# Patient Record
Sex: Female | Born: 1960 | Race: Black or African American | Hispanic: No | Marital: Single | Smoking: Never smoker
Health system: Southern US, Community
[De-identification: ages and names within clinical notes are randomized; demographics above are authoritative.]

---

## 2007-04-13 ENCOUNTER — Emergency Department: Payer: Self-pay | Admitting: Emergency Medicine

## 2007-04-14 ENCOUNTER — Ambulatory Visit: Payer: Self-pay | Admitting: Surgery

## 2007-04-14 ENCOUNTER — Ambulatory Visit: Payer: Self-pay | Admitting: Emergency Medicine

## 2007-04-14 IMAGING — US ABDOMEN ULTRASOUND
1 series · 17 of 25 positions shown · non-contrast
Comparison: none

REASON FOR EXAM: abdominal pain gallstones
COMMENTS:

[Series 1: abdomen ultrasound · 17 of 65 slices shown]
[im 1/65]
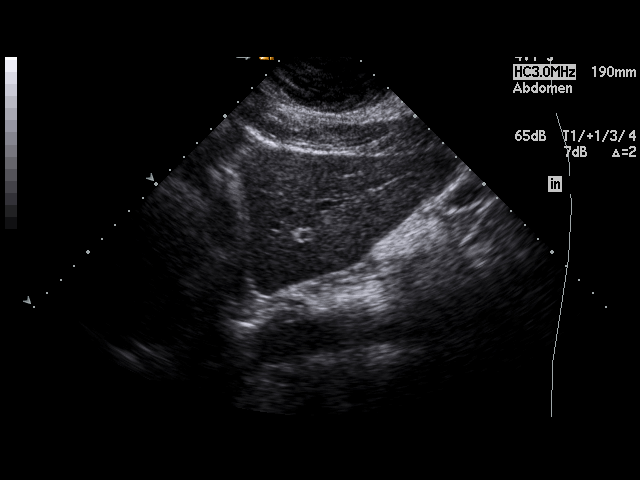
[im 6/65]
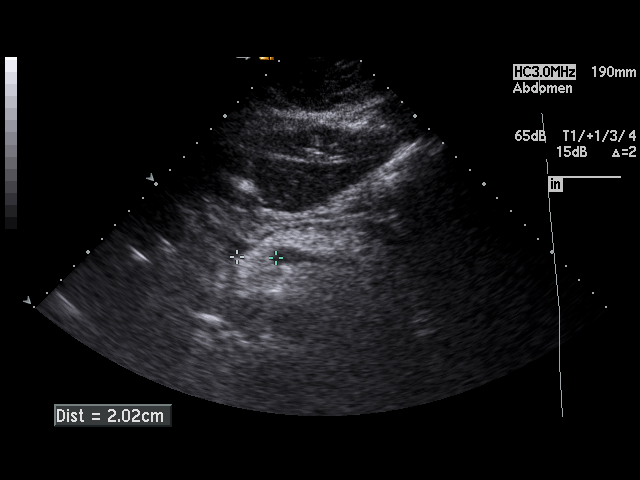
[im 9/65]
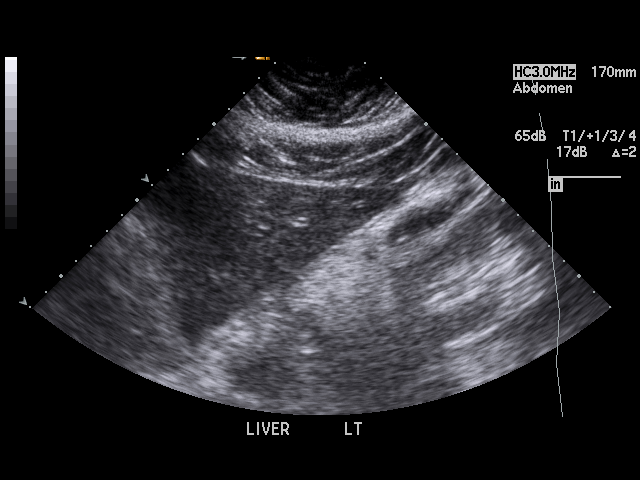
[im 14/65]
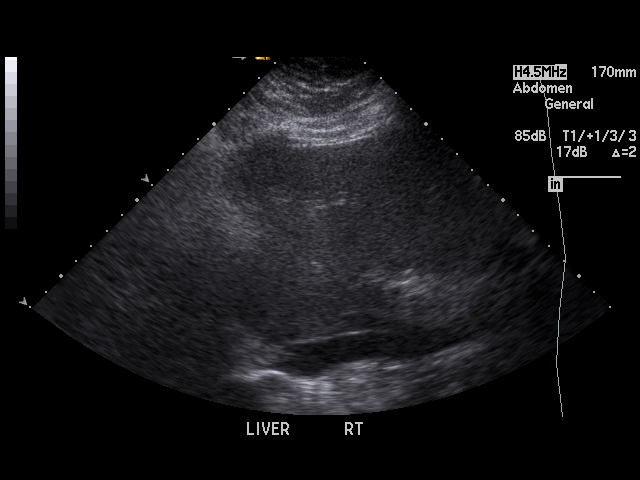
[im 17/65]
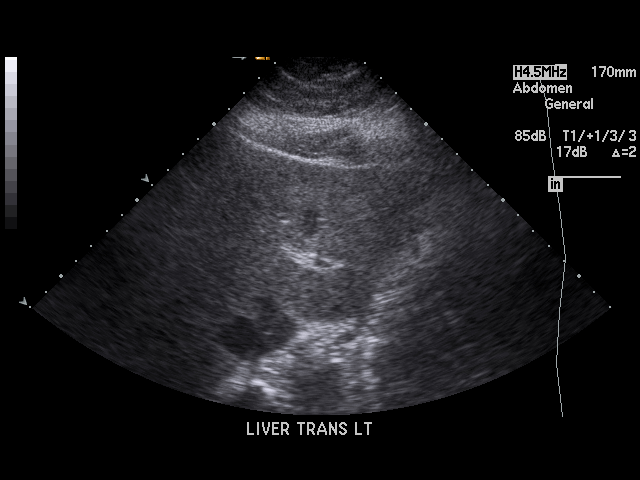
[im 22/65]
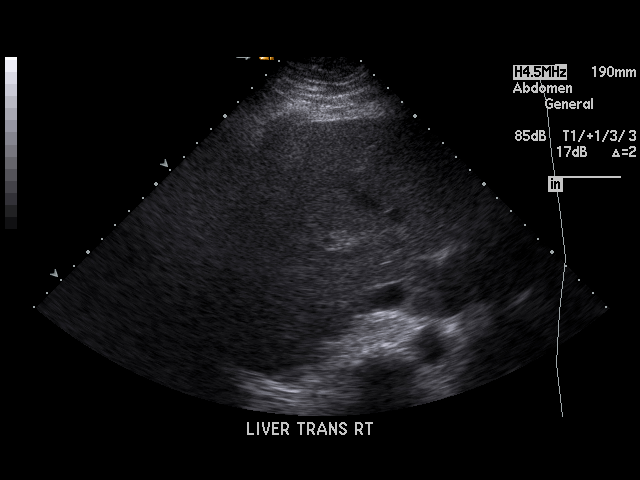
[im 25/65]
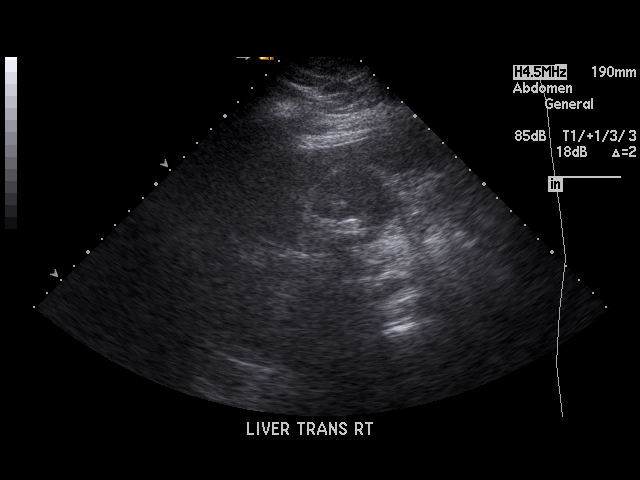
[im 30/65]
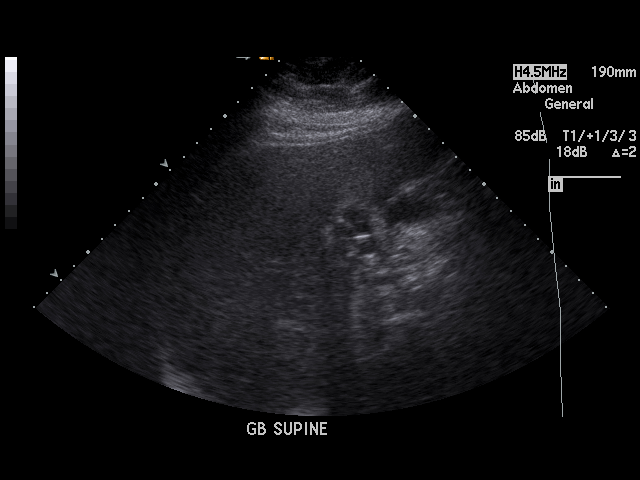
[im 33/65]
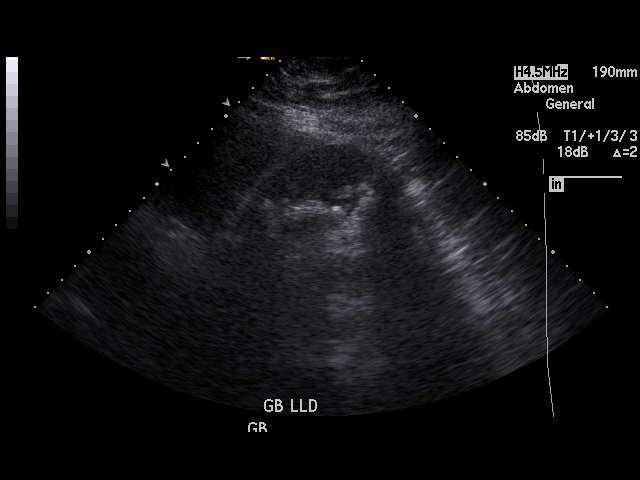
[im 35/65]
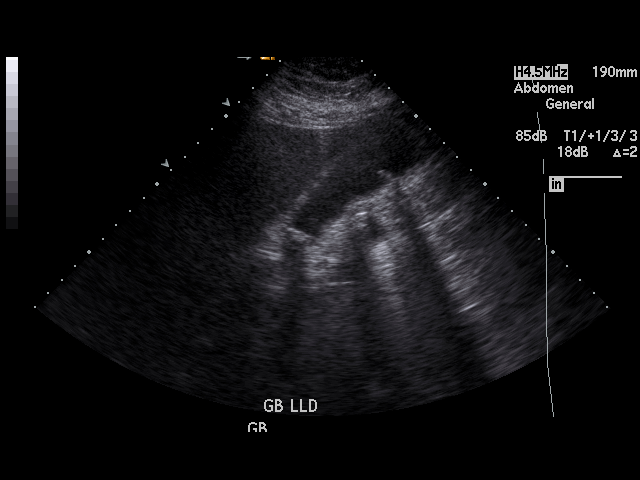
[im 41/65]
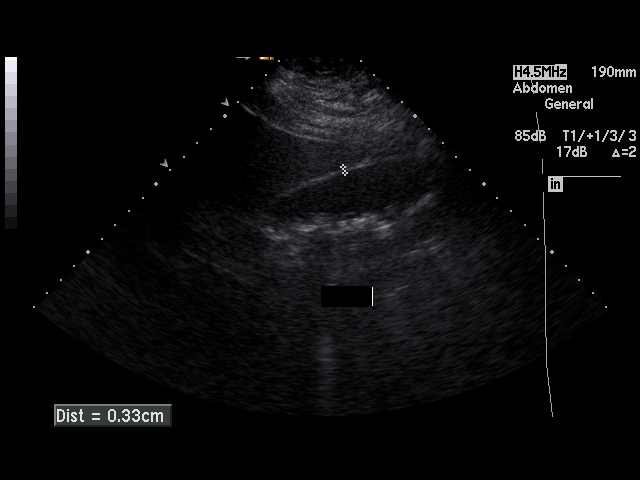
[im 43/65]
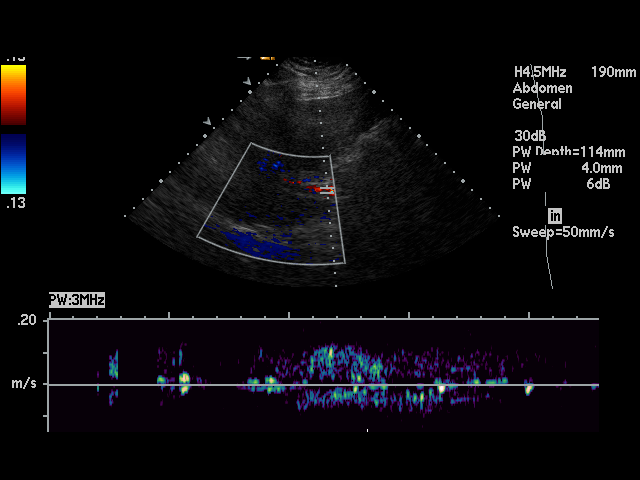
[im 49/65]
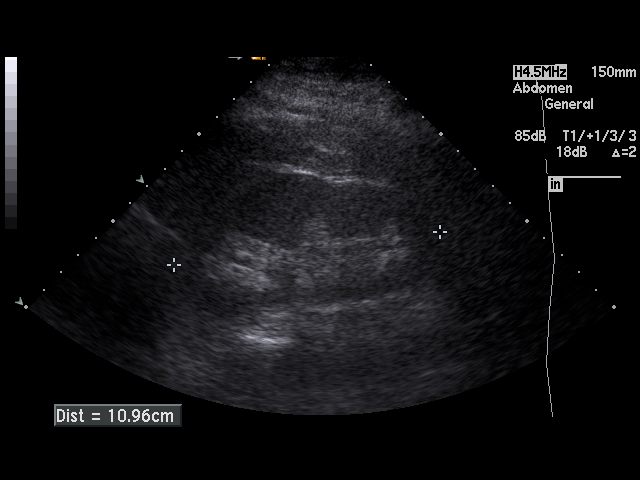
[im 51/65]
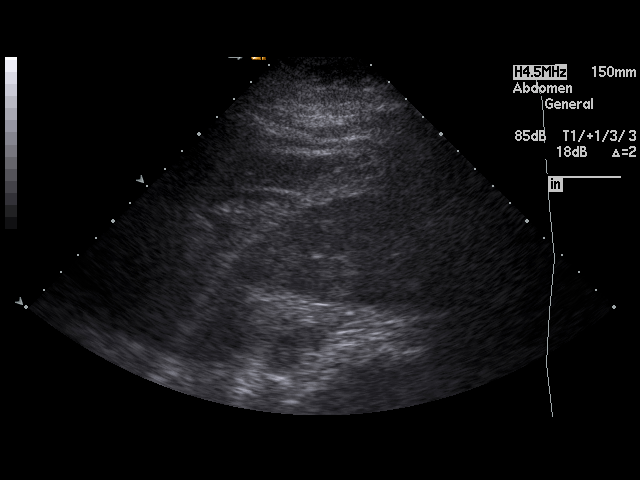
[im 57/65]
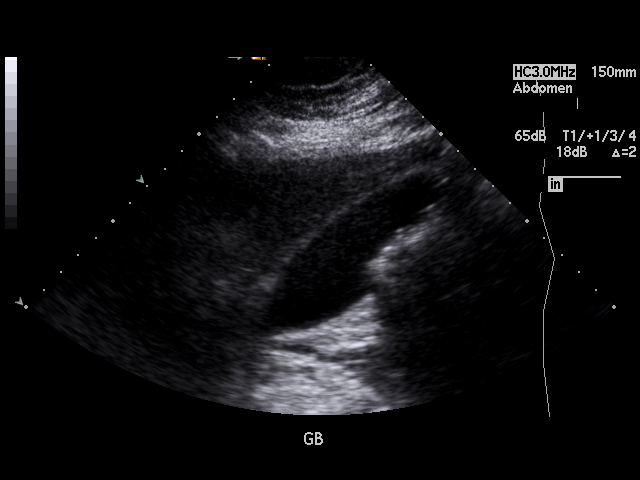
[im 59/65]
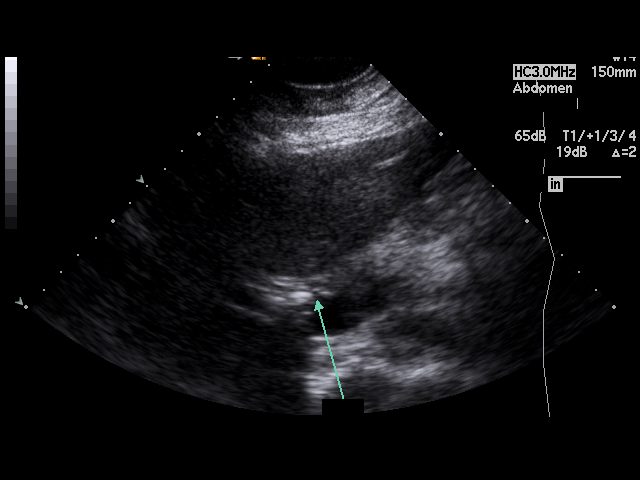
[im 65/65]
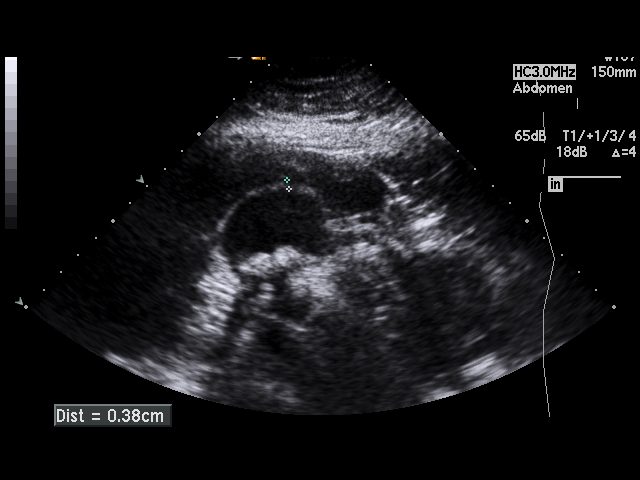

[17 of 25 positions shown; findings below may reference images not displayed]

PROCEDURE:     US  - US ABDOMEN GENERAL SURVEY  - [DATE] [DATE]

RESULT:     The liver exhibits changes consistent with fatty infiltration.
No focal mass or ductal dilation is seen. The gallbladder is contracted and
contains echogenic bile or sludge as well as nonmobile stones. There is no
sonographic Murphy's sign. The gallbladder wall is minimally thickened at
3.8 mm. The common bile duct measures 3.8 mm in diameter as well. The
pancreas, spleen, abdominal aorta, inferior vena cava, and kidneys are
normal in appearance. There is no evidence of ascites.
IMPRESSION: There are multiple gallstones present and the gallbladder is contracted with
somewhat thickened wall. There is no pericholecystic fluid or sonographic
Murphy's sign.

This report was called to the [HOSPITAL] the conclusion of the
study.

## 2008-06-05 ENCOUNTER — Emergency Department: Payer: Self-pay | Admitting: Emergency Medicine

## 2017-04-21 ENCOUNTER — Other Ambulatory Visit: Payer: Self-pay

## 2017-04-21 ENCOUNTER — Encounter: Payer: Medicare HMO | Attending: Pulmonary Disease

## 2017-04-21 VITALS — Ht 65.2 in | Wt 274.2 lb

## 2017-04-21 DIAGNOSIS — J984 Other disorders of lung: Secondary | ICD-10-CM | POA: Diagnosis not present

## 2017-04-21 DIAGNOSIS — R0602 Shortness of breath: Secondary | ICD-10-CM | POA: Diagnosis present

## 2017-04-21 DIAGNOSIS — J849 Interstitial pulmonary disease, unspecified: Secondary | ICD-10-CM

## 2017-04-21 NOTE — Progress Notes (Signed)
Pulmonary Individual Treatment Plan  Patient Details  Name: Nichole Reynolds MRN: [id number] Date of Birth: Jun 14, 1960 Referring Provider:     Pulmonary Rehab from 04/21/2017 in [facility] Cardiac and Pulmonary Rehab  Referring Provider  Normajean Glasgow MD      Initial Encounter Date:    Pulmonary Rehab from 04/21/2017 in [facility] Cardiac and Pulmonary Rehab  Date  04/21/17  Referring Provider  Normajean Glasgow MD      Visit Diagnosis: Interstitial lung disease (HCC)  Patient's Home Medications on Admission: No current outpatient medications on file.  Past Medical History: History reviewed. No pertinent past medical history.  Tobacco Use: Social History   Tobacco Use  Smoking Status Never Smoker  Smokeless Tobacco Never Used    Labs: Recent Review Flowsheet Data    There is no flowsheet data to display.       Pulmonary Assessment Scores: Pulmonary Assessment Scores    Row Name 04/21/17 0931         ADL UCSD   ADL Phase  Entry     SOB Score total  77     Rest  0     Walk  1     Stairs  5     Bath  2     Dress  1     Shop  4       CAT Score   CAT Score  22       mMRC Score   mMRC Score  4        Pulmonary Function Assessment: Pulmonary Function Assessment - 04/21/17 0939      Breath   Bilateral Breath Sounds  Clear    Shortness of Breath  Yes;Limiting activity;Fear of Shortness of Breath       Exercise Target Goals: Date: 04/21/17  Exercise Program Goal: Individual exercise prescription set with THRR, safety & activity barriers. Participant demonstrates ability to understand and report RPE using BORG scale, to self-measure pulse accurately, and to acknowledge the importance of the exercise prescription.  Exercise Prescription Goal: Starting with aerobic activity 30 plus minutes a day, 3 days per week for initial exercise prescription. Provide home exercise prescription and guidelines that participant acknowledges understanding prior to  discharge.  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 HR  137  bpm     Max Ex. BP  146/88     2 Minute Post BP  138/74       Interval HR   1 Minute HR  115     2 Minute HR  125     3 Minute HR  133     4 Minute HR  116     5 Minute HR  109     6 Minute HR  137     2 Minute Post HR  81     Interval Heart Rate?  Yes       Interval Oxygen   Interval Oxygen?  Yes     Baseline Oxygen Saturation %  100 %     1 Minute Oxygen Saturation %  95 %     1 Minute Liters of Oxygen  4 L     2 Minute Oxygen Saturation %  94 %     2 Minute Liters of Oxygen  4 L     3 Minute Oxygen Saturation %  96 % rest 3:38-4:28     3 Minute Liters of Oxygen  4 L     4 Minute Oxygen Saturation %  92 %     4 Minute Liters of Oxygen  4 L     5 Minute Oxygen Saturation %  98 %     5 Minute Liters of Oxygen  4 L     6 Minute Oxygen Saturation %  94 %     6 Minute Liters of Oxygen  4 L     2 Minute Post Oxygen Saturation %  100 %     2 Minute Post Liters of Oxygen  4 L       Oxygen Initial Assessment: Oxygen Initial Assessment - 04/21/17 0940      Home Oxygen   Home Oxygen Device  Home Concentrator;E-Tanks    Sleep Oxygen Prescription  Continuous;CPAP     Liters per minute  6    Home Exercise Oxygen Prescription  Continuous    Liters per minute  4    Home at Rest Exercise Oxygen Prescription  Continuous    Liters per minute  4    Compliance with Home Oxygen Use  Yes      Initial 6 min Walk   Oxygen Used  Continuous    Liters per minute  4      Program Oxygen Prescription   Program Oxygen Prescription  Continuous    Liters per minute  4      Intervention   Short Term Goals  To learn and understand importance of maintaining oxygen saturations>88%;To learn and understand importance of monitoring SPO2 with pulse oximeter and demonstrate accurate use of the pulse oximeter.;To learn and demonstrate proper pursed lip breathing techniques or other breathing techniques.;To learn and exhibit compliance with exercise, home and travel O2 prescription;To learn and demonstrate proper use of respiratory medications Symbicort, Albuterol Nebulizer prn and Ventolin    Long  Term Goals  Exhibits compliance with exercise, home and travel O2 prescription;Verbalizes importance of monitoring SPO2 with pulse oximeter and return demonstration;Maintenance of O2 saturations>88%;Exhibits proper breathing techniques, such as pursed lip breathing or other method taught during program session;Compliance with respiratory medication;Demonstrates proper use of MDI's       Oxygen Re-Evaluation:   Oxygen Discharge (Final Oxygen Re-Evaluation):   Initial Exercise Prescription: Initial Exercise Prescription - 04/21/17 1100      Date of Initial Exercise RX and  Referring Provider   Date  04/21/17    Referring Provider  Normajean Glasgow MD      Oxygen   Oxygen  Continuous    Liters  4      Treadmill   MPH  1.2    Grade  0    Minutes  15    METs  1.92      NuStep   Level  1    SPM  80    Minutes  15    METs  1.9      REL-XR   Level  1    Speed  50    Minutes  15    METs  1.9      Prescription Details   Frequency (times per week)  3    Duration   Progress to 45 minutes of aerobic exercise without signs/symptoms of physical distress      Intensity   THRR 40-80% of Max Heartrate  108-147    Ratings of Perceived Exertion  11-13    Perceived Dyspnea  0-4      Progression   Progression  Continue to progress workloads to maintain intensity without signs/symptoms of physical distress.      Resistance Training   Training Prescription  Yes    Weight  3 lbs    Reps  10-15       Perform Capillary Blood Glucose checks as needed.  Exercise Prescription Changes: Exercise Prescription Changes    Row Name 04/21/17 1100             Response to Exercise   Blood Pressure (Admit)  146/84       Blood Pressure (Exercise)  146/88       Blood Pressure (Exit)  138/74       Heart Rate (Admit)  69 bpm       Heart Rate (Exercise)  137 bpm       Heart Rate (Exit)  81 bpm       Oxygen Saturation (Admit)  100 %       Oxygen Saturation (Exercise)  92 %       Oxygen Saturation (Exit)  100 %       Rating of Perceived Exertion (Exercise)  15       Perceived Dyspnea (Exercise)  1       Symptoms  knee pain 7/10       Comments  walk test results          Exercise Comments:   Exercise Goals and Review: Exercise Goals    Row Name 04/21/17 1111             Exercise Goals   Increase Physical Activity  Yes       Intervention  Provide advice, education, support and counseling about physical activity/exercise needs.;Develop an individualized exercise prescription for aerobic and resistive training based on initial evaluation findings, risk stratification, comorbidities and participant's personal goals.       Expected Outcomes  Achievement of increased cardiorespiratory fitness and enhanced flexibility, muscular endurance and strength shown through measurements of functional capacity and personal statement of participant.       Increase Strength and Stamina  Yes       Intervention  Provide advice, education, support and counseling about  physical activity/exercise needs.;Develop an individualized exercise prescription for aerobic and resistive training based on initial evaluation findings, risk stratification, comorbidities and participant's personal goals.       Expected Outcomes  Achievement of increased cardiorespiratory fitness and enhanced flexibility, muscular endurance and strength shown through measurements of functional capacity and personal statement of participant.       Able to understand and use rate of perceived exertion (RPE) scale  Yes       Intervention  Provide education and explanation on how to use RPE scale       Expected Outcomes  Short Term: Able to use RPE daily in rehab to express subjective intensity level;Long Term:  Able to use RPE to guide intensity level when exercising independently       Able to understand and use Dyspnea scale  Yes       Intervention  Provide education and explanation on how to use Dyspnea scale       Expected Outcomes  Short Term: Able to use Dyspnea scale daily in rehab to express subjective sense of shortness of breath during exertion;Long Term: Able to use Dyspnea scale to guide intensity level when exercising independently       Knowledge and understanding of Target Heart Rate Range (THRR)  Yes       Intervention  Provide education and explanation of THRR including how the numbers were predicted and where they are located for reference       Expected Outcomes  Short Term: Able to state/look up THRR;Long Term: Able to use THRR to govern intensity when exercising independently;Short Term: Able to use daily as guideline for intensity in rehab       Able to check pulse independently  Yes       Intervention  Provide education and demonstration on how to check pulse in carotid and radial arteries.;Review the importance of being able to check your own pulse for safety during independent exercise       Expected Outcomes  Short Term: Able to explain why pulse checking is important during  independent exercise;Long Term: Able to check pulse independently and accurately       Understanding of Exercise Prescription  Yes       Intervention  Provide education, explanation, and written materials on patient's individual exercise prescription       Expected Outcomes  Short Term: Able to explain program exercise prescription;Long Term: Able to explain home exercise prescription to exercise independently          Exercise Goals Re-Evaluation :   Discharge Exercise Prescription (Final Exercise Prescription Changes): Exercise Prescription Changes - 04/21/17 1100      Response to Exercise   Blood Pressure (Admit)  146/84    Blood Pressure (Exercise)  146/88    Blood Pressure (Exit)  138/74    Heart Rate (Admit)  69 bpm    Heart Rate (Exercise)  137 bpm    Heart Rate (Exit)  81 bpm    Oxygen Saturation (Admit)  100 %    Oxygen Saturation (Exercise)  92 %    Oxygen Saturation (Exit)  100 %    Rating of Perceived Exertion (Exercise)  15    Perceived Dyspnea (Exercise)  1    Symptoms  knee pain 7/10    Comments  walk test results       Nutrition:  Target Goals: Understanding of nutrition guidelines, daily intake of sodium 1500mg , cholesterol 200mg , calories 30% from fat and 7% or less from saturated fats, daily to have 5 or more servings of fruits and vegetables.  Biometrics: Pre Biometrics - 04/21/17 1112      Pre Biometrics   Height  5' 5.2" (1.656 m)    Weight  274 lb 3.2 oz (124.4 kg)    Waist Circumference  46.5 inches    Hip Circumference  55 inches    Waist to Hip Ratio  0.85 %    BMI (Calculated)  45.35        Nutrition Therapy Plan and Nutrition Goals: Nutrition Therapy & Goals - 04/21/17 0930      Personal Nutrition Goals   Comments  Patient would like to meet with the dietician. She has had weight loss surgery in July and would like some pointers to help her diet.      Intervention Plan   Intervention  Prescribe, educate and counsel regarding  individualized specific dietary modifications aiming towards targeted core components such as weight, hypertension, lipid management, diabetes, heart failure and other comorbidities.;Nutrition handout(s) given to patient.    Expected Outcomes  Short Term Goal: Understand basic principles of dietary content, such as calories, fat, sodium, cholesterol and nutrients.;Short Term Goal: A plan has been developed with personal nutrition goals set during dietitian appointment.;Long Term Goal: Adherence to prescribed nutrition plan.       Nutrition Discharge: Rate Your Plate Scores:   Nutrition Goals Re-Evaluation:   Nutrition Goals Discharge (Final Nutrition Goals Re-Evaluation):   Psychosocial: Target Goals: Acknowledge presence or absence of significant depression and/or stress, maximize coping skills, provide positive support system. Participant is able to verbalize types and ability to use techniques and skills needed for reducing stress and depression.   Initial Review & Psychosocial Screening: Initial Psych Review & Screening - 04/21/17 0925      Initial Review   Current issues with  Current Depression;Current Stress Concerns;Current Sleep Concerns    Source of Stress Concerns  Chronic Illness;Family;Unable to participate in former interests or hobbies;Unable to perform yard/household activities;Transportation    Comments  He mother died two years ago in a fire. She needs someone to take her to [facility].      Family Dynamics   Good Support System?  Yes    Comments  Cherly HensenCousins are a great support system. Her son is very supportive.      Barriers   Psychosocial barriers to participate in program  The patient should benefit from training in stress management and relaxation.      Screening Interventions   Interventions  Yes;Encouraged to exercise;Program counselor consult;Provide feedback about the scores to participant;To provide support and resources with identified psychosocial needs     Expected Outcomes  Short Term goal: Utilizing psychosocial counselor, staff and physician to assist with identification of specific Stressors or current issues interfering with healing process. Setting desired goal for each stressor or current issue identified.;Long Term Goal: Stressors or current issues are controlled or eliminated.;Short Term goal: Identification and review with participant of any Quality of Life or Depression concerns found by scoring the questionnaire.;Long Term goal: The participant improves quality of Life and PHQ9 Scores as seen by post scores and/or verbalization of changes       Quality of Life Scores:   PHQ-9: Recent Review Flowsheet Data    [facility]   Decreased Interest 3   Down, Depressed, Hopeless 3   PHQ - 2 Score 6   Altered sleeping 0   Tired, decreased energy 1   Change in appetite 0   Feeling bad or failure about yourself  3   Trouble concentrating 3   Moving slowly or fidgety/restless 1   Suicidal thoughts 1  PHQ-9 Score 15   Difficult doing work/chores Somewhat difficult     Interpretation of Total Score  Total Score Depression Severity:  1-4 = Minimal depression, 5-9 = Mild depression, 10-14 = Moderate depression, 15-19 = Moderately severe depression, 20-27 = Severe depression   Psychosocial Evaluation and Intervention:   Psychosocial Re-Evaluation:   Psychosocial Discharge (Final Psychosocial Re-Evaluation):   Education: Education Goals: Education classes will be provided on a weekly basis, covering required topics. Participant will state understanding/return demonstration of topics presented.  Learning Barriers/Preferences:   Education Topics: Initial Evaluation Education: - Verbal, written and demonstration of respiratory meds, RPE/PD scales, oximetry and breathing techniques. Instruction on use of nebulizers and MDIs: cleaning and proper use, rinsing mouth with steroid doses and importance of monitoring  MDI activations.   Pulmonary Rehab from 04/21/2017 in [facility] Cardiac and Pulmonary Rehab  Date  04/21/17  Educator  [facility]  Instruction Review Code  1- Verbalizes Understanding      General Nutrition Guidelines/Fats and Fiber: -Group instruction provided by verbal, written material, models and posters to present the general guidelines for heart healthy nutrition. Gives an explanation and review of dietary fats and fiber.   Controlling Sodium/Reading Food Labels: -Group verbal and written material supporting the discussion of sodium use in heart healthy nutrition. Review and explanation with models, verbal and written materials for utilization of the food label.   Exercise Physiology & Risk Factors: - Group verbal and written instruction with models to review the exercise physiology of the cardiovascular system and associated critical values. Details cardiovascular disease risk factors and the goals associated with each risk factor.   Aerobic Exercise & Resistance Training: - Gives group verbal and written discussion on the health impact of inactivity. On the components of aerobic and resistive training programs and the benefits of this training and how to safely progress through these programs.   Flexibility, Balance, General Exercise Guidelines: - Provides group verbal and written instruction on the benefits of flexibility and balance training programs. Provides general exercise guidelines with specific guidelines to those with heart or lung disease. Demonstration and skill practice provided.   Stress Management: - Provides group verbal and written instruction about the health risks of elevated stress, cause of high stress, and healthy ways to reduce stress.   Depression: - Provides group verbal and written instruction on the correlation between heart/lung disease and depressed mood, treatment options, and the stigmas associated with seeking treatment.   Exercise & Equipment Safety: -  Individual verbal instruction and demonstration of equipment use and safety with use of the equipment.   Pulmonary Rehab from 04/21/2017 in [facility] Cardiac and Pulmonary Rehab  Date  04/21/17  Educator  [facility]  Instruction Review Code  1- Verbalizes Understanding      Infection Prevention: - Provides verbal and written material to individual with discussion of infection control including proper hand washing and proper equipment cleaning during exercise session.   Pulmonary Rehab from 04/21/2017 in [facility] Cardiac and Pulmonary Rehab  Date  04/21/17  Educator  [facility]  Instruction Review Code  1- Verbalizes Understanding      Falls Prevention: - Provides verbal and written material to individual with discussion of falls prevention and safety.   Pulmonary Rehab from 04/21/2017 in [facility] Cardiac and Pulmonary Rehab  Date  04/21/17  Educator  [facility]  Instruction Review Code  1- Verbalizes Understanding      Diabetes: - Individual verbal and written instruction to review signs/symptoms of diabetes, desired ranges of glucose  level fasting, after meals and with exercise. Advice that pre and post exercise glucose checks will be done for 3 sessions at entry of program.   Chronic Lung Diseases: - Group verbal and written instruction to review new updates, new respiratory medications, new advancements in procedures and treatments. Provide informative websites and "800" numbers of self-education.   Lung Procedures: - Group verbal and written instruction to describe testing methods done to diagnose lung disease. Review the outcome of test results. Describe the treatment choices: Pulmonary Function Tests, ABGs and oximetry.   Energy Conservation: - Provide group verbal and written instruction for methods to conserve energy, plan and organize activities. Instruct on pacing techniques, use of adaptive equipment and posture/positioning to relieve shortness of breath.   Triggers: - Group verbal and written instruction  to review types of environmental controls: home humidity, furnaces, filters, dust mite/pet prevention, HEPA vacuums. To discuss weather changes, air quality and the benefits of nasal washing.   Exacerbations: - Group verbal and written instruction to provide: warning signs, infection symptoms, calling MD promptly, preventive modes, and value of vaccinations. Review: effective airway clearance, coughing and/or vibration techniques. Create an Sport and exercise psychologist.   Oxygen: - Individual and group verbal and written instruction on oxygen therapy. Includes supplement oxygen, available portable oxygen systems, continuous and intermittent flow rates, oxygen safety, concentrators, and Medicare reimbursement for oxygen.   Respiratory Medications: - Group verbal and written instruction to review medications for lung disease. Drug class, frequency, complications, importance of spacers, rinsing mouth after steroid MDI's, and proper cleaning methods for nebulizers.   AED/CPR: - Group verbal and written instruction with the use of models to demonstrate the basic use of the AED with the basic ABC's of resuscitation.   Breathing Retraining: - Provides individuals verbal and written instruction on purpose, frequency, and proper technique of diaphragmatic breathing and pursed-lipped breathing. Applies individual practice skills.   Pulmonary Rehab from 04/21/2017 in [facility] Cardiac and Pulmonary Rehab  Date  04/21/17  Educator  [facility]  Instruction Review Code  1- Verbalizes Understanding      Anatomy and Physiology of the Lungs: - Group verbal and written instruction with the use of models to provide basic lung anatomy and physiology related to function, structure and complications of lung disease.   Anatomy & Physiology of the Heart: - Group verbal and written instruction and models provide basic cardiac anatomy and physiology, with the coronary electrical and arterial systems. Review of: AMI, Angina, Valve disease,  Heart Failure, Cardiac Arrhythmia, Pacemakers, and the ICD.   Heart Failure: - Group verbal and written instruction on the basics of heart failure: signs/symptoms, treatments, explanation of ejection fraction, enlarged heart and cardiomyopathy.   Sleep Apnea: - Individual verbal and written instruction to review Obstructive Sleep Apnea. Review of risk factors, methods for diagnosing and types of masks and machines for OSA.   Anxiety: - Provides group, verbal and written instruction on the correlation between heart/lung disease and anxiety, treatment options, and management of anxiety.   Relaxation: - Provides group, verbal and written instruction about the benefits of relaxation for patients with heart/lung disease. Also provides patients with examples of relaxation techniques.   Cardiac Medications: - Group verbal and written instruction to review commonly prescribed medications for heart disease. Reviews the medication, class of the drug, and side effects.   Know Your Numbers: -Group verbal and written instruction about important numbers in your health.  Review of Cholesterol, Blood Pressure, Diabetes, and BMI and the role they play in  your overall health.   Other: -Provides group and verbal instruction on various topics (see comments)    Knowledge Questionnaire Score: Knowledge Questionnaire Score - 04/21/17 0938      Knowledge Questionnaire Score   Pre Score  13/18 Reviewed with patient        Core Components/Risk Factors/Patient Goals at Admission: Personal Goals and Risk Factors at Admission - 04/21/17 0943      Core Components/Risk Factors/Patient Goals on Admission    Weight Management  Yes;Weight Loss;Obesity    Intervention  Weight Management: Develop a combined nutrition and exercise program designed to reach desired caloric intake, while maintaining appropriate intake of nutrient and fiber, sodium and fats, and appropriate energy expenditure required for the  weight goal.;Weight Management: Provide education and appropriate resources to help participant work on and attain dietary goals.;Weight Management/Obesity: Establish reasonable short term and long term weight goals.;Obesity: Provide education and appropriate resources to help participant work on and attain dietary goals.    Admit Weight  274 lb 3.2 oz (124.4 kg)    Goal Weight: Short Term  270 lb (122.5 kg)    Goal Weight: Long Term  200 lb (90.7 kg) eventually she would like to get back to 150 lbs    Expected Outcomes  Short Term: Continue to assess and modify interventions until short term weight is achieved;Long Term: Adherence to nutrition and physical activity/exercise program aimed toward attainment of established weight goal;Weight Loss: Understanding of general recommendations for a balanced deficit meal plan, which promotes 1-2 lb weight loss per week and includes a negative energy balance of [phone] kcal/d;Understanding recommendations for meals to include 15-35% energy as protein, 25-35% energy from fat, 35-60% energy from carbohydrates, less than 200mg  of dietary cholesterol, 20-35 gm of total fiber daily;Understanding of distribution of calorie intake throughout the day with the consumption of 4-5 meals/snacks    Improve shortness of breath with ADL's  Yes    Intervention  Provide education, individualized exercise plan and daily activity instruction to help decrease symptoms of SOB with activities of daily living.    Expected Outcomes  Short Term: Achieves a reduction of symptoms when performing activities of daily living.       Core Components/Risk Factors/Patient Goals Review:    Core Components/Risk Factors/Patient Goals at Discharge (Final Review):    ITP Comments: ITP Comments    Row Name 04/21/17 0903           ITP Comments  Medical Evaluation completed. Chart sent for review and changes to Dr. Bethann Punches Director of [facility]. Diagnosis can be found in [facility] encounter  04/21/2017          Comments: Initial ITP

## 2017-04-21 NOTE — Patient Instructions (Signed)
Patient Instructions  Patient Details  Name: Nichole Reynolds MRN: [id number] Date of Birth: 03/06/1961 Referring Provider:  Graciella Belton, *  Below are the personal goals you chose as well as exercise and nutrition goals. Our goal is to help you keep on track towards obtaining and maintaining your goals. We will be discussing your progress on these goals with you throughout the program.  Initial Exercise Prescription: Initial Exercise Prescription - 04/21/17 1100      Date of Initial Exercise RX and Referring Provider   Date  04/21/17    Referring Provider  Normajean Glasgow MD      Oxygen   Oxygen  Continuous    Liters  4      Treadmill   MPH  1.2    Grade  0    Minutes  15    METs  1.92      NuStep   Level  1    SPM  80    Minutes  15    METs  1.9      REL-XR   Level  1    Speed  50    Minutes  15    METs  1.9      Prescription Details   Frequency (times per week)  3    Duration  Progress to 45 minutes of aerobic exercise without signs/symptoms of physical distress      Intensity   THRR 40-80% of Max Heartrate  108-147    Ratings of Perceived Exertion  11-13    Perceived Dyspnea  0-4      Progression   Progression  Continue to progress workloads to maintain intensity without signs/symptoms of physical distress.      Resistance Training   Training Prescription  Yes    Weight  3 lbs    Reps  10-15       Exercise Goals: Frequency: Be able to perform aerobic exercise three times per week working toward 3-5 days per week.  Intensity: Work with a perceived exertion of 11 (fairly light) - 15 (hard) as tolerated. Follow your new exercise prescription and watch for changes in prescription as you progress with the program. Changes will be reviewed with you when they are made.  Duration: You should be able to do 30 minutes of continuous aerobic exercise in addition to a 5 minute warm-up and a 5 minute cool-down routine.  Nutrition Goals: Your personal  nutrition goals will be established when you do your nutrition analysis with the dietician.  The following are nutrition guidelines to follow: Cholesterol < 200mg /day Sodium < 1500mg /day Fiber: Women over 50 yrs - 21 grams per day  Personal Goals: Personal Goals and Risk Factors at Admission - 04/21/17 0943      Core Components/Risk Factors/Patient Goals on Admission    Weight Management  Yes;Weight Loss;Obesity    Intervention  Weight Management: Develop a combined nutrition and exercise program designed to reach desired caloric intake, while maintaining appropriate intake of nutrient and fiber, sodium and fats, and appropriate energy expenditure required for the weight goal.;Weight Management: Provide education and appropriate resources to help participant work on and attain dietary goals.;Weight Management/Obesity: Establish reasonable short term and long term weight goals.;Obesity: Provide education and appropriate resources to help participant work on and attain dietary goals.    Admit Weight  274 lb 3.2 oz (124.4 kg)    Goal Weight: Short Term  270 lb (122.5 kg)    Goal Weight: Long Term  200 lb (90.7 kg) eventually she would like to get back to 150 lbs    Expected Outcomes  Short Term: Continue to assess and modify interventions until short term weight is achieved;Long Term: Adherence to nutrition and physical activity/exercise program aimed toward attainment of established weight goal;Weight Loss: Understanding of general recommendations for a balanced deficit meal plan, which promotes 1-2 lb weight loss per week and includes a negative energy balance of [phone] kcal/d;Understanding recommendations for meals to include 15-35% energy as protein, 25-35% energy from fat, 35-60% energy from carbohydrates, less than 200mg  of dietary cholesterol, 20-35 gm of total fiber daily;Understanding of distribution of calorie intake throughout the day with the consumption of 4-5 meals/snacks    Improve  shortness of breath with ADL's  Yes    Intervention  Provide education, individualized exercise plan and daily activity instruction to help decrease symptoms of SOB with activities of daily living.    Expected Outcomes  Short Term: Achieves a reduction of symptoms when performing activities of daily living.       Tobacco Use Initial Evaluation: Social History   Tobacco Use  Smoking Status Never Smoker  Smokeless Tobacco Never Used    Exercise Goals and Review: Exercise Goals    Row Name 04/21/17 1111             Exercise Goals   Increase Physical Activity  Yes       Intervention  Provide advice, education, support and counseling about physical activity/exercise needs.;Develop an individualized exercise prescription for aerobic and resistive training based on initial evaluation findings, risk stratification, comorbidities and participant's personal goals.       Expected Outcomes  Achievement of increased cardiorespiratory fitness and enhanced flexibility, muscular endurance and strength shown through measurements of functional capacity and personal statement of participant.       Increase Strength and Stamina  Yes       Intervention  Provide advice, education, support and counseling about physical activity/exercise needs.;Develop an individualized exercise prescription for aerobic and resistive training based on initial evaluation findings, risk stratification, comorbidities and participant's personal goals.       Expected Outcomes  Achievement of increased cardiorespiratory fitness and enhanced flexibility, muscular endurance and strength shown through measurements of functional capacity and personal statement of participant.       Able to understand and use rate of perceived exertion (RPE) scale  Yes       Intervention  Provide education and explanation on how to use RPE scale       Expected Outcomes  Short Term: Able to use RPE daily in rehab to express subjective intensity  level;Long Term:  Able to use RPE to guide intensity level when exercising independently       Able to understand and use Dyspnea scale  Yes       Intervention  Provide education and explanation on how to use Dyspnea scale       Expected Outcomes  Short Term: Able to use Dyspnea scale daily in rehab to express subjective sense of shortness of breath during exertion;Long Term: Able to use Dyspnea scale to guide intensity level when exercising independently       Knowledge and understanding of Target Heart Rate Range (THRR)  Yes       Intervention  Provide education and explanation of THRR including how the numbers were predicted and where they are located for reference       Expected Outcomes  Short Term: Able to state/look  up THRR;Long Term: Able to use THRR to govern intensity when exercising independently;Short Term: Able to use daily as guideline for intensity in rehab       Able to check pulse independently  Yes       Intervention  Provide education and demonstration on how to check pulse in carotid and radial arteries.;Review the importance of being able to check your own pulse for safety during independent exercise       Expected Outcomes  Short Term: Able to explain why pulse checking is important during independent exercise;Long Term: Able to check pulse independently and accurately       Understanding of Exercise Prescription  Yes       Intervention  Provide education, explanation, and written materials on patient's individual exercise prescription       Expected Outcomes  Short Term: Able to explain program exercise prescription;Long Term: Able to explain home exercise prescription to exercise independently          Copy of goals given to participant.

## 2017-04-25 DIAGNOSIS — J984 Other disorders of lung: Secondary | ICD-10-CM | POA: Diagnosis not present

## 2017-04-25 DIAGNOSIS — J849 Interstitial pulmonary disease, unspecified: Secondary | ICD-10-CM

## 2017-04-25 NOTE — Progress Notes (Signed)
Daily Session Note  Patient Details  Name: SHERETTA GRUMBINE MRN: [id number] Date of Birth: [birth year] Referring Provider:     Pulmonary Rehab from 04/21/2017 in [facility]  Referring Provider  Nelda Bucks MD      Encounter Date: 04/25/2017  Check In: Session Check In - 04/25/17 1141      Check-In   Location  [facility]    Staff Present  Justin Mend RCP,RRT,BSRT;Amanda Oletta Darter, BA, ACSM CEP, Exercise Physiologist;Kelly Amedeo Plenty, BS, ACSM CEP, Exercise Physiologist    Supervising physician immediately available to respond to emergencies  [facility] immediately available ER MD    Physician(s)  Dr. Kerman Passey and Jimmye Norman    Medication changes reported      No    Fall or balance concerns reported     No    Warm-up and Cool-down  Performed as group-led instruction    Resistance Training Performed  Yes    VAD Patient?  No      Pain Assessment   Currently in Pain?  No/denies          Social History   Tobacco Use  Smoking Status Never Smoker  Smokeless Tobacco Never Used    Goals Met:  Exercise tolerated well Queuing for purse lip breathing No report of cardiac concerns or symptoms Strength training completed today  Goals Unmet:  Not Applicable  Comments: First full day of exercise!  Patient was oriented to gym and equipment including functions, settings, policies, and procedures.  Patient's individual exercise prescription and treatment plan were reviewed.  All starting workloads were established based on the results of the 6 minute walk test done at initial orientation visit.  The plan for exercise progression was also introduced and progression will be customized based on patient's performance and goals.   Dr. Emily Filbert is Medical Director for [facility] and [facility].

## 2017-05-02 DIAGNOSIS — J849 Interstitial pulmonary disease, unspecified: Secondary | ICD-10-CM

## 2017-05-02 NOTE — Progress Notes (Signed)
Pulmonary Individual Treatment Plan  Patient Details  Name: Nichole Reynolds MRN: [id number] Date of Birth: 02-01-1961 Referring Provider:     Pulmonary Rehab from 04/21/2017 in [facility] Cardiac and Pulmonary Rehab  Referring Provider  Normajean Glasgow MD      Initial Encounter Date:    Pulmonary Rehab from 04/21/2017 in [facility] Cardiac and Pulmonary Rehab  Date  04/21/17  Referring Provider  Normajean Glasgow MD      Visit Diagnosis: Interstitial lung disease (HCC)  Patient's Home Medications on Admission: No current outpatient medications on file.  Past Medical History: No past medical history on file.  Tobacco Use: Social History   Tobacco Use  Smoking Status Never Smoker  Smokeless Tobacco Never Used    Labs: Recent Review Flowsheet Data    There is no flowsheet data to display.       Pulmonary Assessment Scores: Pulmonary Assessment Scores    Row Name 04/21/17 0931         ADL UCSD   ADL Phase  Entry     SOB Score total  77     Rest  0     Walk  1     Stairs  5     Bath  2     Dress  1     Shop  4       CAT Score   CAT Score  22       mMRC Score   mMRC Score  4        Pulmonary Function Assessment: Pulmonary Function Assessment - 04/21/17 0939      Breath   Bilateral Breath Sounds  Clear    Shortness of Breath  Yes;Limiting activity;Fear of Shortness of Breath       Exercise Target Goals:    Exercise Program Goal: Individual exercise prescription set with THRR, safety & activity barriers. Participant demonstrates ability to understand and report RPE using BORG scale, to self-measure pulse accurately, and to acknowledge the importance of the exercise prescription.  Exercise Prescription Goal: Starting with aerobic activity 30 plus minutes a day, 3 days per week for initial exercise prescription. Provide home exercise prescription and guidelines that participant acknowledges understanding prior to discharge.  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 HR  137 bpm  Max Ex. BP  146/88     2 Minute Post BP  138/74       Interval HR   1 Minute HR  115     2 Minute HR  125     3 Minute HR  133     4 Minute HR  116     5 Minute HR  109     6 Minute HR  137     2 Minute Post HR  81     Interval Heart Rate?  Yes       Interval Oxygen   Interval Oxygen?  Yes     Baseline Oxygen Saturation %  100 %     1 Minute Oxygen Saturation %  95 %     1 Minute Liters of Oxygen  4 L     2 Minute Oxygen Saturation %  94 %     2 Minute Liters of Oxygen  4 L     3 Minute Oxygen Saturation %  96 % rest 3:38-4:28     3 Minute Liters of Oxygen  4 L     4 Minute Oxygen Saturation %  92 %     4 Minute Liters of Oxygen  4 L     5 Minute Oxygen Saturation %  98 %     5 Minute Liters of Oxygen  4 L     6 Minute Oxygen Saturation %  94 %     6 Minute Liters of Oxygen  4 L     2 Minute Post Oxygen Saturation %  100 %     2 Minute Post Liters of Oxygen  4 L       Oxygen Initial Assessment: Oxygen Initial Assessment - 04/21/17 0940      Home Oxygen   Home Oxygen Device  Home Concentrator;E-Tanks    Sleep Oxygen Prescription  Continuous;CPAP    Liters per minute  6    Home  Exercise Oxygen Prescription  Continuous    Liters per minute  4    Home at Rest Exercise Oxygen Prescription  Continuous    Liters per minute  4    Compliance with Home Oxygen Use  Yes      Initial 6 min Walk   Oxygen Used  Continuous    Liters per minute  4      Program Oxygen Prescription   Program Oxygen Prescription  Continuous    Liters per minute  4      Intervention   Short Term Goals  To learn and understand importance of maintaining oxygen saturations>88%;To learn and understand importance of monitoring SPO2 with pulse oximeter and demonstrate accurate use of the pulse oximeter.;To learn and demonstrate proper pursed lip breathing techniques or other breathing techniques.;To learn and exhibit compliance with exercise, home and travel O2 prescription;To learn and demonstrate proper use of respiratory medications Symbicort, Albuterol Nebulizer prn and Ventolin    Long  Term Goals  Exhibits compliance with exercise, home and travel O2 prescription;Verbalizes importance of monitoring SPO2 with pulse oximeter and return demonstration;Maintenance of O2 saturations>88%;Exhibits proper breathing techniques, such as pursed lip breathing or other method taught during program session;Compliance with respiratory medication;Demonstrates proper use of MDI's       Oxygen Re-Evaluation: Oxygen Re-Evaluation    Row Name 04/25/17 1143             Goals/Expected Outcomes   Short Term Goals  To learn and understand importance  of maintaining oxygen saturations>88%;To learn and understand importance of monitoring SPO2 with pulse oximeter and demonstrate accurate use of the pulse oximeter.;To learn and demonstrate proper pursed lip breathing techniques or other breathing techniques.;To learn and exhibit compliance with exercise, home and travel O2 prescription;To learn and demonstrate proper use of respiratory medications       Long  Term Goals  Exhibits compliance with exercise, home and travel  O2 prescription;Verbalizes importance of monitoring SPO2 with pulse oximeter and return demonstration;Maintenance of O2 saturations>88%;Exhibits proper breathing techniques, such as pursed lip breathing or other method taught during program session;Compliance with respiratory medication;Demonstrates proper use of MDI's       Comments  Reviewed PLB technique with pt.  Talked about how it work and it's important to maintaining his exercise saturations.         Goals/Expected Outcomes  Short: Become more profiecient at using PLB.   Long: Become independent at using PLB.          Oxygen Discharge (Final Oxygen Re-Evaluation): Oxygen Re-Evaluation - 04/25/17 1143      Goals/Expected Outcomes   Short Term Goals  To learn and understand importance of maintaining oxygen saturations>88%;To learn and understand importance of monitoring SPO2 with pulse oximeter and demonstrate accurate use of the pulse oximeter.;To learn and demonstrate proper pursed lip breathing techniques or other breathing techniques.;To learn and exhibit compliance with exercise, home and travel O2 prescription;To learn and demonstrate proper use of respiratory medications    Long  Term Goals  Exhibits compliance with exercise, home and travel O2 prescription;Verbalizes importance of monitoring SPO2 with pulse oximeter and return demonstration;Maintenance of O2 saturations>88%;Exhibits proper breathing techniques, such as pursed lip breathing or other method taught during program session;Compliance with respiratory medication;Demonstrates proper use of MDI's    Comments  Reviewed PLB technique with pt.  Talked about how it work and it's important to maintaining his exercise saturations.      Goals/Expected Outcomes  Short: Become more profiecient at using PLB.   Long: Become independent at using PLB.       Initial Exercise Prescription: Initial Exercise Prescription - 04/21/17 1100      Date of Initial Exercise RX and Referring  Provider   Date  04/21/17    Referring Provider  Normajean Glasgow MD      Oxygen   Oxygen  Continuous    Liters  4      Treadmill   MPH  1.2    Grade  0    Minutes  15    METs  1.92      NuStep   Level  1    SPM  80    Minutes  15    METs  1.9      REL-XR   Level  1    Speed  50    Minutes  15    METs  1.9      Prescription Details   Frequency (times per week)  3    Duration  Progress to 45 minutes of aerobic exercise without signs/symptoms of physical distress      Intensity   THRR 40-80% of Max Heartrate  108-147    Ratings of Perceived Exertion  11-13    Perceived Dyspnea  0-4      Progression   Progression  Continue to progress workloads to maintain intensity without signs/symptoms of physical distress.      Resistance Training   Training Prescription  Yes    Weight  3 lbs    Reps  10-15       Perform Capillary Blood Glucose checks as needed.  Exercise Prescription Changes: Exercise Prescription Changes    Row Name 04/21/17 1100             Response to Exercise   Blood Pressure (Admit)  146/84       Blood Pressure (Exercise)  146/88       Blood Pressure (Exit)  138/74       Heart Rate (Admit)  69 bpm       Heart Rate (Exercise)  137 bpm       Heart Rate (Exit)  81 bpm       Oxygen Saturation (Admit)  100 %       Oxygen Saturation (Exercise)  92 %       Oxygen Saturation (Exit)  100 %       Rating of Perceived Exertion (Exercise)  15       Perceived Dyspnea (Exercise)  1       Symptoms  knee pain 7/10       Comments  walk test results          Exercise Comments: Exercise Comments    Row Name 04/25/17 1142           Exercise Comments  First full day of exercise!  Patient was oriented to gym and equipment including functions, settings, policies, and procedures.  Patient's individual exercise prescription and treatment plan were reviewed.  All starting workloads were established based on the results of the 6 minute walk test done at  initial orientation visit.  The plan for exercise progression was also introduced and progression will be customized based on patient's performance and goals.          Exercise Goals and Review: Exercise Goals    Row Name 04/21/17 1111             Exercise Goals   Increase Physical Activity  Yes       Intervention  Provide advice, education, support and counseling about physical activity/exercise needs.;Develop an individualized exercise prescription for aerobic and resistive training based on initial evaluation findings, risk stratification, comorbidities and participant's personal goals.       Expected Outcomes  Achievement of increased cardiorespiratory fitness and enhanced flexibility, muscular endurance and strength shown through measurements of functional capacity and personal statement of participant.       Increase Strength and Stamina  Yes       Intervention  Provide advice, education, support and counseling about physical activity/exercise needs.;Develop an individualized exercise prescription for aerobic and resistive training based on initial evaluation findings, risk stratification, comorbidities and participant's personal goals.       Expected Outcomes  Achievement of increased cardiorespiratory fitness and enhanced flexibility, muscular endurance and strength shown through measurements of functional capacity and personal statement of participant.       Able to understand and use rate of perceived exertion (RPE) scale  Yes       Intervention  Provide education and explanation on how to use RPE scale       Expected Outcomes  Short Term: Able to use RPE daily in rehab to express subjective intensity level;Long Term:  Able to use RPE to guide intensity level when exercising independently       Able to understand and use Dyspnea scale  Yes       Intervention  Provide education and explanation on how  to use Dyspnea scale       Expected Outcomes  Short Term: Able to use Dyspnea scale  daily in rehab to express subjective sense of shortness of breath during exertion;Long Term: Able to use Dyspnea scale to guide intensity level when exercising independently       Knowledge and understanding of Target Heart Rate Range (THRR)  Yes       Intervention  Provide education and explanation of THRR including how the numbers were predicted and where they are located for reference       Expected Outcomes  Short Term: Able to state/look up THRR;Long Term: Able to use THRR to govern intensity when exercising independently;Short Term: Able to use daily as guideline for intensity in rehab       Able to check pulse independently  Yes       Intervention  Provide education and demonstration on how to check pulse in carotid and radial arteries.;Review the importance of being able to check your own pulse for safety during independent exercise       Expected Outcomes  Short Term: Able to explain why pulse checking is important during independent exercise;Long Term: Able to check pulse independently and accurately       Understanding of Exercise Prescription  Yes       Intervention  Provide education, explanation, and written materials on patient's individual exercise prescription       Expected Outcomes  Short Term: Able to explain program exercise prescription;Long Term: Able to explain home exercise prescription to exercise independently          Exercise Goals Re-Evaluation : Exercise Goals Re-Evaluation    Row Name 04/25/17 1143             Exercise Goal Re-Evaluation   Exercise Goals Review  Able to understand and use Dyspnea scale;Understanding of Exercise Prescription;Knowledge and understanding of Target Heart Rate Range (THRR);Able to understand and use rate of perceived exertion (RPE) scale       Comments  Reviewed RPE scale, THR and program prescription with pt today.  Pt voiced understanding and was given a copy of goals to take home.        Expected Outcomes  Short: Use RPE daily to  regulate intensity.  Long: Follow program prescription in THR.          Discharge Exercise Prescription (Final Exercise Prescription Changes): Exercise Prescription Changes - 04/21/17 1100      Response to Exercise   Blood Pressure (Admit)  146/84    Blood Pressure (Exercise)  146/88    Blood Pressure (Exit)  138/74    Heart Rate (Admit)  69 bpm    Heart Rate (Exercise)  137 bpm    Heart Rate (Exit)  81 bpm    Oxygen Saturation (Admit)  100 %    Oxygen Saturation (Exercise)  92 %    Oxygen Saturation (Exit)  100 %    Rating of Perceived Exertion (Exercise)  15    Perceived Dyspnea (Exercise)  1    Symptoms  knee pain 7/10    Comments  walk test results       Nutrition:  Target Goals: Understanding of nutrition guidelines, daily intake of sodium 1500mg , cholesterol 200mg , calories 30% from fat and 7% or less from saturated fats, daily to have 5 or more servings of fruits and vegetables.  Biometrics: Pre Biometrics - 04/21/17 1112      Pre Biometrics   Height  5' 5.2" (  1.656 m)    Weight  274 lb 3.2 oz (124.4 kg)    Waist Circumference  46.5 inches    Hip Circumference  55 inches    Waist to Hip Ratio  0.85 %    BMI (Calculated)  45.35        Nutrition Therapy Plan and Nutrition Goals: Nutrition Therapy & Goals - 04/25/17 1341      Nutrition Therapy   Diet  TLC/ post bariatric surgery    Protein (specify units)  60g    Fiber  20 grams    Sodium  1500 grams      Personal Nutrition Goals   Nutrition Goal  Increase intake of non-starchy vegetables    Personal Goal #2  When eating out, choose a lean protein source + a side of veggies/ fruit or yogurt    Personal Goal #3  Continue to strive to eat on a regular schedule each day, aim for at least 3 meals / day      Intervention Plan   Intervention  Prescribe, educate and counsel regarding individualized specific dietary modifications aiming towards targeted core components such as weight, hypertension, lipid  management, diabetes, heart failure and other comorbidities.    Expected Outcomes  Short Term Goal: Understand basic principles of dietary content, such as calories, fat, sodium, cholesterol and nutrients.;Short Term Goal: A plan has been developed with personal nutrition goals set during dietitian appointment.;Long Term Goal: Adherence to prescribed nutrition plan.       Nutrition Discharge: Rate Your Plate Scores:   Nutrition Goals Re-Evaluation:   Nutrition Goals Discharge (Final Nutrition Goals Re-Evaluation):   Psychosocial: Target Goals: Acknowledge presence or absence of significant depression and/or stress, maximize coping skills, provide positive support system. Participant is able to verbalize types and ability to use techniques and skills needed for reducing stress and depression.   Initial Review & Psychosocial Screening: Initial Psych Review & Screening - 04/21/17 0925      Initial Review   Current issues with  Current Depression;Current Stress Concerns;Current Sleep Concerns    Source of Stress Concerns  Chronic Illness;Family;Unable to participate in former interests or hobbies;Unable to perform yard/household activities;Transportation    Comments  He mother died two years ago in a fire. She needs someone to take her to [facility].      Family Dynamics   Good Support System?  Yes    Comments  Cherly Hensen are a great support system. Her son is very supportive.      Barriers   Psychosocial barriers to participate in program  The patient should benefit from training in stress management and relaxation.      Screening Interventions   Interventions  Yes;Encouraged to exercise;Program counselor consult;Provide feedback about the scores to participant;To provide support and resources with identified psychosocial needs    Expected Outcomes  Short Term goal: Utilizing psychosocial counselor, staff and physician to assist with identification of specific Stressors or current issues  interfering with healing process. Setting desired goal for each stressor or current issue identified.;Long Term Goal: Stressors or current issues are controlled or eliminated.;Short Term goal: Identification and review with participant of any Quality of Life or Depression concerns found by scoring the questionnaire.;Long Term goal: The participant improves quality of Life and PHQ9 Scores as seen by post scores and/or verbalization of changes       Quality of Life Scores:   PHQ-9: Recent Review Flowsheet Data    Depression screen [facility] 2/9 04/21/2017   Decreased  Interest 3   Down, Depressed, Hopeless 3   PHQ - 2 Score 6   Altered sleeping 0   Tired, decreased energy 1   Change in appetite 0   Feeling bad or failure about yourself  3   Trouble concentrating 3   Moving slowly or fidgety/restless 1   Suicidal thoughts 1    PHQ-9 Score 15   Difficult doing work/chores Somewhat difficult     Interpretation of Total Score  Total Score Depression Severity:  1-4 = Minimal depression, 5-9 = Mild depression, 10-14 = Moderate depression, 15-19 = Moderately severe depression, 20-27 = Severe depression   Psychosocial Evaluation and Intervention:   Psychosocial Re-Evaluation:   Psychosocial Discharge (Final Psychosocial Re-Evaluation):   Education: Education Goals: Education classes will be provided on a weekly basis, covering required topics. Participant will state understanding/return demonstration of topics presented.  Learning Barriers/Preferences:   Education Topics: Initial Evaluation Education: - Verbal, written and demonstration of respiratory meds, RPE/PD scales, oximetry and breathing techniques. Instruction on use of nebulizers and MDIs: cleaning and proper use, rinsing mouth with steroid doses and importance of monitoring MDI activations.   [facility]  Date  04/21/17  Educ[facility]  [facility]  Instruction Review Code  1- Verbalizes  Understanding      General Nutrition Guidelines/Fats and Fiber: -Group instruction provided by verbal, written material, models and posters to present the general guidelines for heart healthy nutrition. Gives an explanation and review of dietary fats and fiber.   Controlling Sodium/Reading Food Labels: -Group verbal and written material supporting the discussion of sodium use in heart healthy nutrition. Review and explanation with models, verbal and written materials for utilization of the food label.   Exercise Physiology & Risk Factors: - Group verbal and written instruction with models to review the exercise physiology of the cardiovascular system and associated critical values. Details cardiovascular disease risk factors and the goals associated with each risk factor.   Aerobic Exercise & Resistance Training: - Gives group verbal and written discussion on the health impact of inactivity. On the components of aerobic and resistive training programs and the benefits of this training and how to safely progress through these programs.   Flexibility, Balance, General Exercise Guidelines: - Provides group verbal and written instruction on the benefits of flexibility and balance training programs. Provides general exercise guidelines with specific guidelines to those with heart or lung disease. Demonstration and skill practice provided.   Stress Management: - Provides group verbal and written instruction about the health risks of elevated stress, cause of high stress, and healthy ways to reduce stress.   Depression: - Provides group verbal and written instruction on the correlation between heart/lung disease and depressed mood, treatment options, and the stigmas associated with seeking treatment.   Exercise & Equipment Safety: - Individual verbal instruction and demonstration of equipment use and safety with use of the equipment.   [facility]  Date  04/21/17  [facility]  [facility]  Instruction Review Code  1- Verbalizes Understanding      Infection Prevention: - Provides verbal and written material to individual with discussion of infection control including proper hand washing and proper equipment cleaning during exercise session.   [facility]  Date  04/21/17  Educator  [facility]  Instruction Review Code  1- Verbalizes Understanding      Falls Prevention: - Provides  verbal and written material to individual with discussion of falls prevention and safety.   Pulmonary Rehab from 04/21/2017 in [facility] and Pulmonary Rehab  Date  04/21/17  Educator  [facility]  Instruction Review Code  1- Verbalizes Understanding      Diabetes: - Individual verbal and written instruction to review signs/symptoms of diabetes, desired ranges of glucose level fasting, after meals and with exercise. Advice that pre and post exercise glucose checks will be done for 3 sessions at entry of program.   Chronic Lung Diseases: - Group verbal and written instruction to review new updates, new respiratory medications, new advancements in procedures and treatments. Provide informative websites and "800" numbers of self-education.   Lung Procedures: - Group verbal and written instruction to describe testing methods done to diagnose lung disease. Review the outcome of test results. Describe the treatment choices: Pulmonary Function Tests, ABGs and oximetry.   Energy Conservation: - Provide group verbal and written instruction for methods to conserve energy, plan and organize activities. Instruct on pacing techniques, use of adaptive equipment and posture/positioning to relieve shortness of breath.   Triggers: - Group verbal and written instruction to review types of environmental controls: home humidity, furnaces, filters, dust mite/pet prevention, HEPA vacuums. To discuss weather changes, air  quality and the benefits of nasal washing.   Exacerbations: - Group verbal and written instruction to provide: warning signs, infection symptoms, calling MD promptly, preventive modes, and value of vaccinations. Review: effective airway clearance, coughing and/or vibration techniques. Create an Sport and exercise psychologist.   Oxygen: - Individual and group verbal and written instruction on oxygen therapy. Includes supplement oxygen, available portable oxygen systems, continuous and intermittent flow rates, oxygen safety, concentrators, and Medicare reimbursement for oxygen.   Respiratory Medications: - Group verbal and written instruction to review medications for lung disease. Drug class, frequency, complications, importance of spacers, rinsing mouth after steroid MDI's, and proper cleaning methods for nebulizers.   AED/CPR: - Group verbal and written instruction with the use of models to demonstrate the basic use of the AED with the basic ABC's of resuscitation.   Breathing Retraining: - Provides individuals verbal and written instruction on purpose, frequency, and proper technique of diaphragmatic breathing and pursed-lipped breathing. Applies individual practice skills.   Pulmonary Rehab from 04/21/2017 in [facility]  Date  04/21/17  Educator  [facility]  Instruction Review Code  1- Verbalizes Understanding      Anatomy and Physiology of the Lungs: - Group verbal and written instruction with the use of models to provide basic lung anatomy and physiology related to function, structure and complications of lung disease.   Anatomy & Physiology of the Heart: - Group verbal and written instruction and models provide basic cardiac anatomy and physiology, with the coronary electrical and arterial systems. Review of: AMI, Angina, Valve disease, Heart Failure, Cardiac Arrhythmia, Pacemakers, and the ICD.   Heart Failure: - Group verbal and written instruction on the basics of heart  failure: signs/symptoms, treatments, explanation of ejection fraction, enlarged heart and cardiomyopathy.   Sleep Apnea: - Individual verbal and written instruction to review Obstructive Sleep Apnea. Review of risk factors, methods for diagnosing and types of masks and machines for OSA.   Anxiety: - Provides group, verbal and written instruction on the correlation between heart/lung disease and anxiety, treatment options, and management of anxiety.   Relaxation: - Provides group, verbal and written instruction about the benefits of relaxation for patients with heart/lung disease. Also provides patients with examples  of relaxation techniques.   Cardiac Medications: - Group verbal and written instruction to review commonly prescribed medications for heart disease. Reviews the medication, class of the drug, and side effects.   Know Your Numbers: -Group verbal and written instruction about important numbers in your health.  Review of Cholesterol, Blood Pressure, Diabetes, and BMI and the role they play in your overall health.   Other: -Provides group and verbal instruction on various topics (see comments)    Knowledge Questionnaire Score: Knowledge Questionnaire Score - 04/21/17 0938      Knowledge Questionnaire Score   Pre Score  13/18 Reviewed with patient        Core Components/Risk Factors/Patient Goals at Admission: Personal Goals and Risk Factors at Admission - 04/21/17 0943      Core Components/Risk Factors/Patient Goals on Admission    Weight Management  Yes;Weight Loss;Obesity    Intervention  Weight Management: Develop a combined nutrition and exercise program designed to reach desired caloric intake, while maintaining appropriate intake of nutrient and fiber, sodium and fats, and appropriate energy expenditure required for the weight goal.;Weight Management: Provide education and appropriate resources to help participant work on and attain dietary goals.;Weight  Management/Obesity: Establish reasonable short term and long term weight goals.;Obesity: Provide education and appropriate resources to help participant work on and attain dietary goals.    Admit Weight  274 lb 3.2 oz (124.4 kg)    Goal Weight: Short Term  270 lb (122.5 kg)    Goal Weight: Long Term  200 lb (90.7 kg) eventually she would like to get back to 150 lbs    Expected Outcomes  Short Term: Continue to assess and modify interventions until short term weight is achieved;Long Term: Adherence to nutrition and physical activity/exercise program aimed toward attainment of established weight goal;Weight Loss: Understanding of general recommendations for a balanced deficit meal plan, which promotes 1-2 lb weight loss per week and includes a negative energy balance of [phone] kcal/d;Understanding recommendations for meals to include 15-35% energy as protein, 25-35% energy from fat, 35-60% energy from carbohydrates, less than 200mg  of dietary cholesterol, 20-35 gm of total fiber daily;Understanding of distribution of calorie intake throughout the day with the consumption of 4-5 meals/snacks    Improve shortness of breath with ADL's  Yes    Intervention  Provide education, individualized exercise plan and daily activity instruction to help decrease symptoms of SOB with activities of daily living.    Expected Outcomes  Short Term: Achieves a reduction of symptoms when performing activities of daily living.       Core Components/Risk Factors/Patient Goals Review:    Core Components/Risk Factors/Patient Goals at Discharge (Final Review):    ITP Comments: ITP Comments    Row Name 04/21/17 0903 05/02/17 0837         ITP Comments  Medical Evaluation completed. Chart sent for review and changes to Dr. Bethann Punches Director of [facility]. Diagnosis can be found in [facility] encounter 04/21/2017  30 day review completed. ITP sent to Dr. Bethann Punches Director of [facility]. Continue with ITP unless changes are made  by physician.         Comments: 30 day review

## 2017-05-09 ENCOUNTER — Telehealth: Payer: Self-pay

## 2017-05-09 NOTE — Telephone Encounter (Signed)
Called Nichole Reynolds to see if she was interested in returning to [facility]. Unable to leave a  Message.

## 2017-05-10 ENCOUNTER — Telehealth: Payer: Self-pay

## 2017-05-10 NOTE — Telephone Encounter (Signed)
Called Suzette BattiestVeronica again to see about continuing the program. No answer. Unable to leave message.

## 2017-05-13 ENCOUNTER — Telehealth: Payer: Self-pay

## 2017-05-13 DIAGNOSIS — J849 Interstitial pulmonary disease, unspecified: Secondary | ICD-10-CM

## 2017-05-13 NOTE — Progress Notes (Signed)
Pulmonary Individual Treatment Plan  Patient Details  Name: Nichole Reynolds MRN: [id number] Date of Birth: 1960-06-24 Referring Provider:     Pulmonary Rehab from 04/21/2017 in [facility] Cardiac and Pulmonary Rehab  Referring Provider  Normajean Glasgow MD      Initial Encounter Date:    Pulmonary Rehab from 04/21/2017 in [facility] Cardiac and Pulmonary Rehab  Date  04/21/17  Referring Provider  Normajean Glasgow MD      Visit Diagnosis: Interstitial lung disease (HCC)  Patient's Home Medications on Admission: No current outpatient medications on file.  Past Medical History: No past medical history on file.  Tobacco Use: Social History   Tobacco Use  Smoking Status Never Smoker  Smokeless Tobacco Never Used    Labs: Recent Review Flowsheet Data    There is no flowsheet data to display.       Pulmonary Assessment Scores: Pulmonary Assessment Scores    Row Name 04/21/17 0931         ADL UCSD   ADL Phase  Entry     SOB Score total  77     Rest  0     Walk  1     Stairs  5     Bath  2     Dress  1     Shop  4       CAT Score   CAT Score  22       mMRC Score   mMRC Score  4        Pulmonary Function Assessment: Pulmonary Function Assessment - 04/21/17 0939      Breath   Bilateral Breath Sounds  Clear    Shortness of Breath  Yes;Limiting activity;Fear of Shortness of Breath       Exercise Target Goals:    Exercise Program Goal: Individual exercise prescription set using results from initial 6 min walk test and THRR while considering  patient's activity barriers and safety.    Exercise Prescription Goal: Initial exercise prescription builds to 30-45 minutes a day of aerobic activity, 2-3 days per week.  Home exercise guidelines will be given to patient during program as part of exercise prescription that the participant will acknowledge.  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 HR  137 bpm  Max Ex. BP  146/88     2 Minute Post BP  138/74       Interval HR   1 Minute HR  115     2 Minute HR  125     3 Minute HR  133     4 Minute HR  116     5 Minute HR  109     6 Minute HR  137     2 Minute Post HR  81     Interval Heart Rate?  Yes       Interval Oxygen   Interval Oxygen?  Yes     Baseline Oxygen Saturation %  100 %     1 Minute Oxygen Saturation %  95 %     1 Minute Liters of Oxygen  4 L     2 Minute Oxygen Saturation %  94 %     2 Minute Liters of Oxygen  4 L     3 Minute Oxygen Saturation %  96 % rest 3:38-4:28     3 Minute Liters of Oxygen  4 L     4 Minute Oxygen Saturation %  92 %     4 Minute Liters of Oxygen  4 L     5 Minute Oxygen Saturation %  98 %     5 Minute Liters of Oxygen  4 L     6 Minute Oxygen Saturation %  94 %     6 Minute Liters of Oxygen  4 L     2 Minute Post Oxygen Saturation %  100 %     2 Minute Post Liters of Oxygen  4 L       Oxygen Initial Assessment: Oxygen Initial Assessment - 04/21/17 0940      Home Oxygen   Home Oxygen Device  Home Concentrator;E-Tanks    Sleep Oxygen Prescription  Continuous;CPAP    Liters per minute  6    Home Exercise Oxygen Prescription  Continuous    Liters per minute  4    Home at Rest Exercise  Oxygen Prescription  Continuous    Liters per minute  4    Compliance with Home Oxygen Use  Yes      Initial 6 min Walk   Oxygen Used  Continuous    Liters per minute  4      Program Oxygen Prescription   Program Oxygen Prescription  Continuous    Liters per minute  4      Intervention   Short Term Goals  To learn and understand importance of maintaining oxygen saturations>88%;To learn and understand importance of monitoring SPO2 with pulse oximeter and demonstrate accurate use of the pulse oximeter.;To learn and demonstrate proper pursed lip breathing techniques or other breathing techniques.;To learn and exhibit compliance with exercise, home and travel O2 prescription;To learn and demonstrate proper use of respiratory medications Symbicort, Albuterol Nebulizer prn and Ventolin    Long  Term Goals  Exhibits compliance with exercise, home and travel O2 prescription;Verbalizes importance of monitoring SPO2 with pulse oximeter and return demonstration;Maintenance of O2 saturations>88%;Exhibits proper breathing techniques, such as pursed lip breathing or other method taught during program session;Compliance with respiratory medication;Demonstrates proper use of MDI's       Oxygen Re-Evaluation: Oxygen Re-Evaluation    Row Name 04/25/17 1143             Goals/Expected Outcomes   Short Term Goals  To learn and understand importance  of maintaining oxygen saturations>88%;To learn and understand importance of monitoring SPO2 with pulse oximeter and demonstrate accurate use of the pulse oximeter.;To learn and demonstrate proper pursed lip breathing techniques or other breathing techniques.;To learn and exhibit compliance with exercise, home and travel O2 prescription;To learn and demonstrate proper use of respiratory medications       Long  Term Goals  Exhibits compliance with exercise, home and travel O2 prescription;Verbalizes importance of monitoring SPO2 with pulse oximeter and return  demonstration;Maintenance of O2 saturations>88%;Exhibits proper breathing techniques, such as pursed lip breathing or other method taught during program session;Compliance with respiratory medication;Demonstrates proper use of MDI's       Comments  Reviewed PLB technique with pt.  Talked about how it work and it's important to maintaining his exercise saturations.         Goals/Expected Outcomes  Short: Become more profiecient at using PLB.   Long: Become independent at using PLB.          Oxygen Discharge (Final Oxygen Re-Evaluation): Oxygen Re-Evaluation - 04/25/17 1143      Goals/Expected Outcomes   Short Term Goals  To learn and understand importance of maintaining oxygen saturations>88%;To learn and understand importance of monitoring SPO2 with pulse oximeter and demonstrate accurate use of the pulse oximeter.;To learn and demonstrate proper pursed lip breathing techniques or other breathing techniques.;To learn and exhibit compliance with exercise, home and travel O2 prescription;To learn and demonstrate proper use of respiratory medications    Long  Term Goals  Exhibits compliance with exercise, home and travel O2 prescription;Verbalizes importance of monitoring SPO2 with pulse oximeter and return demonstration;Maintenance of O2 saturations>88%;Exhibits proper breathing techniques, such as pursed lip breathing or other method taught during program session;Compliance with respiratory medication;Demonstrates proper use of MDI's    Comments  Reviewed PLB technique with pt.  Talked about how it work and it's important to maintaining his exercise saturations.      Goals/Expected Outcomes  Short: Become more profiecient at using PLB.   Long: Become independent at using PLB.       Initial Exercise Prescription: Initial Exercise Prescription - 04/21/17 1100      Date of Initial Exercise RX and Referring Provider   Date  04/21/17    Referring Provider  Normajean Glasgow MD      Oxygen    Oxygen  Continuous    Liters  4      Treadmill   MPH  1.2    Grade  0    Minutes  15    METs  1.92      NuStep   Level  1    SPM  80    Minutes  15    METs  1.9      REL-XR   Level  1    Speed  50    Minutes  15    METs  1.9      Prescription Details   Frequency (times per week)  3    Duration  Progress to 45 minutes of aerobic exercise without signs/symptoms of physical distress      Intensity   THRR 40-80% of Max Heartrate  108-147    Ratings of Perceived Exertion  11-13    Perceived Dyspnea  0-4      Progression   Progression  Continue to progress workloads to maintain intensity without signs/symptoms of physical distress.      Resistance Training   Training Prescription  Yes    Weight  3 lbs    Reps  10-15       Perform Capillary Blood Glucose checks as needed.  Exercise Prescription Changes: Exercise Prescription Changes    Row Name 04/21/17 1100 05/04/17 0800           Response to Exercise   Blood Pressure (Admit)  146/84  126/76      Blood Pressure (Exercise)  146/88  150/80      Blood Pressure (Exit)  138/74  120/84      Heart Rate (Admit)  69 bpm  92 bpm      Heart Rate (Exercise)  137 bpm  122 bpm      Heart Rate (Exit)  81 bpm  84 bpm      Oxygen Saturation (Admit)  100 %  99 %      Oxygen Saturation (Exercise)  92 %  95 %      Oxygen Saturation (Exit)  100 %  100 %      Rating of Perceived Exertion (Exercise)  15  11      Perceived Dyspnea (Exercise)  1  1      Symptoms  knee pain 7/10  none      Comments  walk test results  first full day of exercise      Duration  -  Progress to 45 minutes of aerobic exercise without signs/symptoms of physical distress      Intensity  -  THRR unchanged        Progression   Progression  -  Continue to progress workloads to maintain intensity without signs/symptoms of physical distress.      Average METs  -  1.5        Resistance Training   Training Prescription  -  Yes      Weight  -  3 lbs       Reps  -  10-15        Interval Training   Interval Training  -  No        Oxygen   Oxygen  -  Continuous      Liters  -  4        Treadmill   MPH  -  0.6      Grade  -  0      Minutes  -  15      METs  -  1.5        NuStep   Level  -  1      SPM  -  67      Minutes  -  15      METs  -  1.7        REL-XR   Level  -  1      Speed  -  40      Minutes  -  15      METs  -  1.3         Exercise Comments: Exercise Comments    Row Name 04/25/17 1142           Exercise Comments  First full day of exercise!  Patient was oriented to gym and equipment including functions, settings, policies, and procedures.  Patient's individual exercise prescription and treatment plan were reviewed.  All starting workloads were established based on the results of the 6 minute walk test done at initial orientation visit.  The plan for exercise progression was also introduced and progression will  be customized based on patient's performance and goals.          Exercise Goals and Review: Exercise Goals    Row Name 04/21/17 1111             Exercise Goals   Increase Physical Activity  Yes       Intervention  Provide advice, education, support and counseling about physical activity/exercise needs.;Develop an individualized exercise prescription for aerobic and resistive training based on initial evaluation findings, risk stratification, comorbidities and participant's personal goals.       Expected Outcomes  Achievement of increased cardiorespiratory fitness and enhanced flexibility, muscular endurance and strength shown through measurements of functional capacity and personal statement of participant.       Increase Strength and Stamina  Yes       Intervention  Provide advice, education, support and counseling about physical activity/exercise needs.;Develop an individualized exercise prescription for aerobic and resistive training based on initial evaluation findings, risk stratification,  comorbidities and participant's personal goals.       Expected Outcomes  Achievement of increased cardiorespiratory fitness and enhanced flexibility, muscular endurance and strength shown through measurements of functional capacity and personal statement of participant.       Able to understand and use rate of perceived exertion (RPE) scale  Yes       Intervention  Provide education and explanation on how to use RPE scale       Expected Outcomes  Short Term: Able to use RPE daily in rehab to express subjective intensity level;Long Term:  Able to use RPE to guide intensity level when exercising independently       Able to understand and use Dyspnea scale  Yes       Intervention  Provide education and explanation on how to use Dyspnea scale       Expected Outcomes  Short Term: Able to use Dyspnea scale daily in rehab to express subjective sense of shortness of breath during exertion;Long Term: Able to use Dyspnea scale to guide intensity level when exercising independently       Knowledge and understanding of Target Heart Rate Range (THRR)  Yes       Intervention  Provide education and explanation of THRR including how the numbers were predicted and where they are located for reference       Expected Outcomes  Short Term: Able to state/look up THRR;Long Term: Able to use THRR to govern intensity when exercising independently;Short Term: Able to use daily as guideline for intensity in rehab       Able to check pulse independently  Yes       Intervention  Provide education and demonstration on how to check pulse in carotid and radial arteries.;Review the importance of being able to check your own pulse for safety during independent exercise       Expected Outcomes  Short Term: Able to explain why pulse checking is important during independent exercise;Long Term: Able to check pulse independently and accurately       Understanding of Exercise Prescription  Yes       Intervention  Provide education,  explanation, and written materials on patient's individual exercise prescription       Expected Outcomes  Short Term: Able to explain program exercise prescription;Long Term: Able to explain home exercise prescription to exercise independently          Exercise Goals Re-Evaluation : Exercise Goals Re-Evaluation    Row Name 04/25/17 1143 05/04/17 0805  Exercise Goal Re-Evaluation   Exercise Goals Review  Able to understand and use Dyspnea scale;Understanding of Exercise Prescription;Knowledge and understanding of Target Heart Rate Range (THRR);Able to understand and use rate of perceived exertion (RPE) scale  Increase Physical Activity;Increase Strength and Stamina;Understanding of Exercise Prescription      Comments  Reviewed RPE scale, THR and program prescription with pt today.  Pt voiced understanding and was given a copy of goals to take home.   Nichole Reynolds has only attended one full day of exercise.  She did well in class.  We will continue to monitor her progression.       Expected Outcomes  Short: Use RPE daily to regulate intensity.  Long: Follow program prescription in THR.  Short: Attend [facility] regularly.  Long: Continue to follow program prescription.          Discharge Exercise Prescription (Final Exercise Prescription Changes): Exercise Prescription Changes - 05/04/17 0800      Response to Exercise   Blood Pressure (Admit)  126/76    Blood Pressure (Exercise)  150/80    Blood Pressure (Exit)  120/84    Heart Rate (Admit)  92 bpm    Heart Rate (Exercise)  122 bpm    Heart Rate (Exit)  84 bpm    Oxygen Saturation (Admit)  99 %    Oxygen Saturation (Exercise)  95 %    Oxygen Saturation (Exit)  100 %    Rating of Perceived Exertion (Exercise)  11    Perceived Dyspnea (Exercise)  1    Symptoms  none    Comments  first full day of exercise    Duration  Progress to 45 minutes of aerobic exercise without signs/symptoms of physical distress    Intensity  THRR  unchanged      Progression   Progression  Continue to progress workloads to maintain intensity without signs/symptoms of physical distress.    Average METs  1.5      Resistance Training   Training Prescription  Yes    Weight  3 lbs    Reps  10-15      Interval Training   Interval Training  No      Oxygen   Oxygen  Continuous    Liters  4      Treadmill   MPH  0.6    Grade  0    Minutes  15    METs  1.5      NuStep   Level  1    SPM  67    Minutes  15    METs  1.7      REL-XR   Level  1    Speed  40    Minutes  15    METs  1.3       Nutrition:  Target Goals: Understanding of nutrition guidelines, daily intake of sodium 1500mg , cholesterol 200mg , calories 30% from fat and 7% or less from saturated fats, daily to have 5 or more servings of fruits and vegetables.  Biometrics: Pre Biometrics - 04/21/17 1112      Pre Biometrics   Height  5' 5.2" (1.656 m)    Weight  274 lb 3.2 oz (124.4 kg)    Waist Circumference  46.5 inches    Hip Circumference  55 inches    Waist to Hip Ratio  0.85 %    BMI (Calculated)  45.35        Nutrition Therapy Plan and Nutrition Goals: Nutrition Therapy &  Goals - 04/25/17 1341      Nutrition Therapy   Diet  TLC/ post bariatric surgery    Protein (specify units)  60g    Fiber  20 grams    Sodium  1500 grams      Personal Nutrition Goals   Nutrition Goal  Increase intake of non-starchy vegetables    Personal Goal #2  When eating out, choose a lean protein source + a side of veggies/ fruit or yogurt    Personal Goal #3  Continue to strive to eat on a regular schedule each day, aim for at least 3 meals / day      Intervention Plan   Intervention  Prescribe, educate and counsel regarding individualized specific dietary modifications aiming towards targeted core components such as weight, hypertension, lipid management, diabetes, heart failure and other comorbidities.    Expected Outcomes  Short Term Goal: Understand basic  principles of dietary content, such as calories, fat, sodium, cholesterol and nutrients.;Short Term Goal: A plan has been developed with personal nutrition goals set during dietitian appointment.;Long Term Goal: Adherence to prescribed nutrition plan.       Nutrition Assessments:   Nutrition Goals Re-Evaluation:   Nutrition Goals Discharge (Final Nutrition Goals Re-Evaluation):   Psychosocial: Target Goals: Acknowledge presence or absence of significant depression and/or stress, maximize coping skills, provide positive support system. Participant is able to verbalize types and ability to use techniques and skills needed for reducing stress and depression.   Initial Review & Psychosocial Screening: Initial Psych Review & Screening - 04/21/17 0925      Initial Review   Current issues with  Current Depression;Current Stress Concerns;Current Sleep Concerns    Source of Stress Concerns  Chronic Illness;Family;Unable to participate in former interests or hobbies;Unable to perform yard/household activities;Transportation    Comments  He mother died two years ago in a fire. She needs someone to take her to [facility].      Family Dynamics   Good Support System?  Yes    Comments  Cherly Hensen are a great support system. Her son is very supportive.      Barriers   Psychosocial barriers to participate in program  The patient should benefit from training in stress management and relaxation.      Screening Interventions   Interventions  Yes;Encouraged to exercise;Program counselor consult;Provide feedback about the scores to participant;To provide support and resources with identified psychosocial needs    Expected Outcomes  Short Term goal: Utilizing psychosocial counselor, staff and physician to assist with identification of specific Stressors or current issues interfering with healing process. Setting desired goal for each stressor or current issue identified.;Long Term Goal: Stressors or current  issues are controlled or eliminated.;Short Term goal: Identification and review with participant of any Quality of Life or Depression concerns found by scoring the questionnaire.;Long Term goal: The participant improves quality of Life and PHQ9 Scores as seen by post scores and/or verbalization of changes       Quality of Life Scores:  Scores of 19 and below usually indicate a poorer quality of life in these areas.  A difference of  2-3 points is a clinically meaningful difference.  A difference of 2-3 points in the total score of the Quality of Life Index has been associated with significant improvement in overall quality of life, self-image, physical symptoms, and general health in studies assessing change in quality of life.  PHQ-9: Recent Review Flowsheet Data    Depression screen [facility] 2/9 04/21/2017  Decreased Interest 3   Down, Depressed, Hopeless 3   PHQ - 2 Score 6   Altered sleeping 0   Tired, decreased energy 1   Change in appetite 0   Feeling bad or failure about yourself  3   Trouble concentrating 3   Moving slowly or fidgety/restless 1   Suicidal thoughts 1    PHQ-9 Score 15   Difficult doing work/chores Somewhat difficult     Interpretation of Total Score  Total Score Depression Severity:  1-4 = Minimal depression, 5-9 = Mild depression, 10-14 = Moderate depression, 15-19 = Moderately severe depression, 20-27 = Severe depression   Psychosocial Evaluation and Intervention:   Psychosocial Re-Evaluation:   Psychosocial Discharge (Final Psychosocial Re-Evaluation):   Education: Education Goals: Education classes will be provided on a weekly basis, covering required topics. Participant will state understanding/return demonstration of topics presented.  Learning Barriers/Preferences:   Education Topics: Initial Evaluation Education: - Verbal, written and demonstration of respiratory meds, RPE/PD scales, oximetry and breathing techniques. Instruction on use of  nebulizers and MDIs: cleaning and proper use, rinsing mouth with steroid doses and importance of monitoring MDI activations.   Pulmonary Rehab from 04/21/2017 in [facility] Cardiac and Pulmonary Rehab  Date  04/21/17  Educator  [facility]  Instruction Review Code  1- Verbalizes Understanding      General Nutrition Guidelines/Fats and Fiber: -Group instruction provided by verbal, written material, models and posters to present the general guidelines for heart healthy nutrition. Gives an explanation and review of dietary fats and fiber.   Controlling Sodium/Reading Food Labels: -Group verbal and written material supporting the discussion of sodium use in heart healthy nutrition. Review and explanation with models, verbal and written materials for utilization of the food label.   Exercise Physiology & Risk Factors: - Group verbal and written instruction with models to review the exercise physiology of the cardiovascular system and associated critical values. Details cardiovascular disease risk factors and the goals associated with each risk factor.   Aerobic Exercise & Resistance Training: - Gives group verbal and written discussion on the health impact of inactivity. On the components of aerobic and resistive training programs and the benefits of this training and how to safely progress through these programs.   Flexibility, Balance, General Exercise Guidelines: - Provides group verbal and written instruction on the benefits of flexibility and balance training programs. Provides general exercise guidelines with specific guidelines to those with heart or lung disease. Demonstration and skill practice provided.   Stress Management: - Provides group verbal and written instruction about the health risks of elevated stress, cause of high stress, and healthy ways to reduce stress.   Depression: - Provides group verbal and written instruction on the correlation between heart/lung disease and depressed mood,  treatment options, and the stigmas associated with seeking treatment.   Exercise & Equipment Safety: - Individual verbal instruction and demonstration of equipment use and safety with use of the equipment.   Pulmonary Rehab from 04/21/2017 in [facility] Cardiac and Pulmonary Rehab  Date  04/21/17  Educator  [facility]  Instruction Review Code  1- Verbalizes Understanding      Infection Prevention: - Provides verbal and written material to individual with discussion of infection control including proper hand washing and proper equipment cleaning during exercise session.   Pulmonary Rehab from 04/21/2017 in [facility] Cardiac and Pulmonary Rehab  Date  04/21/17  Educator  [facility]  Instruction Review Code  1- Verbalizes Understanding      Falls Prevention: -  Provides verbal and written material to individual with discussion of falls prevention and safety.   Pulmonary Rehab from 04/21/2017 in [facility] Cardiac and Pulmonary Rehab  Date  04/21/17  Educator  [facility]  Instruction Review Code  1- Verbalizes Understanding      Diabetes: - Individual verbal and written instruction to review signs/symptoms of diabetes, desired ranges of glucose level fasting, after meals and with exercise. Advice that pre and post exercise glucose checks will be done for 3 sessions at entry of program.   Chronic Lung Diseases: - Group verbal and written instruction to review new updates, new respiratory medications, new advancements in procedures and treatments. Provide informative websites and "800" numbers of self-education.   Lung Procedures: - Group verbal and written instruction to describe testing methods done to diagnose lung disease. Review the outcome of test results. Describe the treatment choices: Pulmonary Function Tests, ABGs and oximetry.   Energy Conservation: - Provide group verbal and written instruction for methods to conserve energy, plan and organize activities. Instruct on pacing techniques, use of adaptive equipment and  posture/positioning to relieve shortness of breath.   Triggers: - Group verbal and written instruction to review types of environmental controls: home humidity, furnaces, filters, dust mite/pet prevention, HEPA vacuums. To discuss weather changes, air quality and the benefits of nasal washing.   Exacerbations: - Group verbal and written instruction to provide: warning signs, infection symptoms, calling MD promptly, preventive modes, and value of vaccinations. Review: effective airway clearance, coughing and/or vibration techniques. Create an Sport and exercise psychologist.   Oxygen: - Individual and group verbal and written instruction on oxygen therapy. Includes supplement oxygen, available portable oxygen systems, continuous and intermittent flow rates, oxygen safety, concentrators, and Medicare reimbursement for oxygen.   Respiratory Medications: - Group verbal and written instruction to review medications for lung disease. Drug class, frequency, complications, importance of spacers, rinsing mouth after steroid MDI's, and proper cleaning methods for nebulizers.   AED/CPR: - Group verbal and written instruction with the use of models to demonstrate the basic use of the AED with the basic ABC's of resuscitation.   Breathing Retraining: - Provides individuals verbal and written instruction on purpose, frequency, and proper technique of diaphragmatic breathing and pursed-lipped breathing. Applies individual practice skills.   Pulmonary Rehab from 04/21/2017 in [facility]  Date  04/21/17  Educator  [facility]  Instruction Review Code  1- Verbalizes Understanding      Anatomy and Physiology of the Lungs: - Group verbal and written instruction with the use of models to provide basic lung anatomy and physiology related to function, structure and complications of lung disease.   Anatomy & Physiology of the Heart: - Group verbal and written instruction and models provide basic cardiac anatomy  and physiology, with the coronary electrical and arterial systems. Review of: AMI, Angina, Valve disease, Heart Failure, Cardiac Arrhythmia, Pacemakers, and the ICD.   Heart Failure: - Group verbal and written instruction on the basics of heart failure: signs/symptoms, treatments, explanation of ejection fraction, enlarged heart and cardiomyopathy.   Sleep Apnea: - Individual verbal and written instruction to review Obstructive Sleep Apnea. Review of risk factors, methods for diagnosing and types of masks and machines for OSA.   Anxiety: - Provides group, verbal and written instruction on the correlation between heart/lung disease and anxiety, treatment options, and management of anxiety.   Relaxation: - Provides group, verbal and written instruction about the benefits of relaxation for patients with heart/lung disease. Also provides patients with  examples of relaxation techniques.   Cardiac Medications: - Group verbal and written instruction to review commonly prescribed medications for heart disease. Reviews the medication, class of the drug, and side effects.   Know Your Numbers: -Group verbal and written instruction about important numbers in your health.  Review of Cholesterol, Blood Pressure, Diabetes, and BMI and the role they play in your overall health.   Other: -Provides group and verbal instruction on various topics (see comments)    Knowledge Questionnaire Score: Knowledge Questionnaire Score - 04/21/17 0938      Knowledge Questionnaire Score   Pre Score  13/18 Reviewed with patient        Core Components/Risk Factors/Patient Goals at Admission: Personal Goals and Risk Factors at Admission - 04/21/17 0943      Core Components/Risk Factors/Patient Goals on Admission    Weight Management  Yes;Weight Loss;Obesity    Intervention  Weight Management: Develop a combined nutrition and exercise program designed to reach desired caloric intake, while maintaining  appropriate intake of nutrient and fiber, sodium and fats, and appropriate energy expenditure required for the weight goal.;Weight Management: Provide education and appropriate resources to help participant work on and attain dietary goals.;Weight Management/Obesity: Establish reasonable short term and long term weight goals.;Obesity: Provide education and appropriate resources to help participant work on and attain dietary goals.    Admit Weight  274 lb 3.2 oz (124.4 kg)    Goal Weight: Short Term  270 lb (122.5 kg)    Goal Weight: Long Term  200 lb (90.7 kg) eventually she would like to get back to 150 lbs    Expected Outcomes  Short Term: Continue to assess and modify interventions until short term weight is achieved;Long Term: Adherence to nutrition and physical activity/exercise program aimed toward attainment of established weight goal;Weight Loss: Understanding of general recommendations for a balanced deficit meal plan, which promotes 1-2 lb weight loss per week and includes a negative energy balance of [phone] kcal/d;Understanding recommendations for meals to include 15-35% energy as protein, 25-35% energy from fat, 35-60% energy from carbohydrates, less than 200mg  of dietary cholesterol, 20-35 gm of total fiber daily;Understanding of distribution of calorie intake throughout the day with the consumption of 4-5 meals/snacks    Improve shortness of breath with ADL's  Yes    Intervention  Provide education, individualized exercise plan and daily activity instruction to help decrease symptoms of SOB with activities of daily living.    Expected Outcomes  Short Term: Achieves a reduction of symptoms when performing activities of daily living.       Core Components/Risk Factors/Patient Goals Review:    Core Components/Risk Factors/Patient Goals at Discharge (Final Review):    ITP Comments: ITP Comments    Row Name 04/21/17 0903 05/02/17 0837 05/13/17 0910 05/13/17 0912     ITP Comments   Medical Evaluation completed. Chart sent for review and changes to Dr. Bethann PunchesMark Miller Director of [facility]. Diagnosis can be found in [facility] encounter 04/21/2017  30 day review completed. ITP sent to Dr. Bethann PunchesMark Miller Director of [facility]. Continue with ITP unless changes are made by physician.  Nichole BattiestVeronica has not attended [facility] since 04/25/16. Called patient times 3. She stated that she had no way of getting to class. Informed her that if she can get regular transportation that she could continue the program. Patient verbalizes understanding.  Discharge ITP sent and signed by Dr. Hyacinth MeekerMiller.  Discharge Summary routed to PCP and pulmonologist.       Comments: discharge  ITP

## 2017-05-13 NOTE — Progress Notes (Signed)
Discharge Progress Report  Patient Details  Name: Nichole Reynolds MRN: [id number] Date of Birth: [birth year] Referring Provider:     Pulmonary Rehab from 04/21/2017 in [facility] Re[facility]  Referring Provider  Normajean Glasgow MD       Number of Visits: 2/36  Reason for Discharge:  Early Exit:  Personal and no transportation  Smoking History:  Social History   Tobacco Use  Smoking Status Never Smoker  Smokeless Tobacco Never Used    Diagnosis:  Interstitial lung disease (HCC)  ADL UCSD: Pulmonary Assessment Scores    Row Name 04/21/17 0931         ADL UCSD   ADL Phase  Entry     SOB Score total  77     Rest  0     Walk  1     Stairs  5     Bath  2     Dress  1     Shop  4       CAT Score   CAT Score  22       mMRC Score   mMRC Score  4        Initial Exercise Prescription: Initial Exercise Prescription - 04/21/17 1100      Date of Initial Exercise RX and Referring Provider   Date  04/21/17    Referring Provider  Normajean Glasgow MD      Oxygen   Oxygen  Continuous    Liters  4      Treadmill   MPH  1.2    Grade  0    Minutes  15    METs  1.92      NuStep   Level  1    SPM  80    Minutes  15    METs  1.9      REL-XR   Level  1    Speed  50    Minutes  15    METs  1.9      Prescription Details   Frequency (times per week)  3    Duration  Progress to 45 minutes of aerobic exercise without signs/symptoms of physical distress      Intensity   THRR 40-80% of Max Heartrate  108-147    Ratings of Perceived Exertion  11-13    Perceived Dyspnea  0-4      Progression   Progression  Continue to progress workloads to maintain intensity without signs/symptoms of physical distress.      Resistance Training   Training Prescription  Yes    Weight  3 lbs    Reps  10-15       Discharge Exercise Prescription (Final Exercise Prescription Changes): Exercise Prescription Changes - 05/04/17 0800      Response to Exercise    Blood Pressure (Admit)  126/76    Blood Pressure (Exercise)  150/80    Blood Pressure (Exit)  120/84    Heart Rate (Admit)  92 bpm    Heart Rate (Exercise)  122 bpm    Heart Rate (Exit)  84 bpm    Oxygen Saturation (Admit)  99 %    Oxygen Saturation (Exercise)  95 %    Oxygen Saturation (Exit)  100 %    Rating of Perceived Exertion (Exercise)  11    Perceived Dyspnea (Exercise)  1    Symptoms  none    Comments  first full day of exercise    Duration  Progress to 45 minutes of aerobic exercise without signs/symptoms of physical distress    Intensity  THRR unchanged      Progression   Progression  Continue to progress workloads to maintain intensity without signs/symptoms of physical distress.    Average METs  1.5      Resistance Training   Training Prescription  Yes    Weight  3 lbs    Reps  10-15      Interval Training   Interval Training  No      Oxygen   Oxygen  Continuous    Liters  4      Treadmill   MPH  0.6    Grade  0    Minutes  15    METs  1.5      NuStep   Level  1    SPM  67    Minutes  15    METs  1.7      REL-XR   Level  1    Speed  40    Minutes  15    METs  1.3       Functional Capacity: 6 Minute Walk    Row Name 04/21/17 1106         6 Minute Walk   Phase  Initial     Distance  545 feet     Walk Time  5.17 minutes     # of Rest Breaks  1 50 sec     MPH  1.2     METS  1.95     RPE  15     Perceived Dyspnea   1     VO2 Peak  6.84     Symptoms  Yes (comment)     Comments  knee pain 7/10 medial     Resting HR  69 bpm     Resting BP  146/84     Resting Oxygen Saturation   100 %     Exercise Oxygen Saturation  during 6 min walk  92 %     Max Ex. HR  137 bpm     Max Ex. BP  146/88     2 Minute Post BP  138/74       Interval HR   1 Minute HR  115     2 Minute HR  125     3 Minute HR  133     4 Minute HR  116     5 Minute HR  109     6 Minute HR  137     2 Minute Post HR  81     Interval Heart Rate?  Yes       Interval  Oxygen   Interval Oxygen?  Yes     Baseline Oxygen Saturation %  100 %     1 Minute Oxygen Saturation %  95 %     1 Minute Liters of Oxygen  4 L     2 Minute Oxygen Saturation %  94 %     2 Minute Liters of Oxygen  4 L     3 Minute Oxygen Saturation %  96 % rest 3:38-4:28     3 Minute Liters of Oxygen  4 L     4 Minute Oxygen Saturation %  92 %     4 Minute Liters of Oxygen  4 L     5 Minute Oxygen Saturation %  98 %  5 Minute Liters of Oxygen  4 L     6 Minute Oxygen Saturation %  94 %     6 Minute Liters of Oxygen  4 L     2 Minute Post Oxygen Saturation %  100 %     2 Minute Post Liters of Oxygen  4 L        Psychological, QOL, Others - Outcomes: PHQ 2/9: Depression screen PHQ 2/9 04/21/2017  Decreased Interest 3  Reynolds, Depressed, Hopeless 3  PHQ - 2 Score 6  Altered sleeping 0  Tired, decreased energy 1  Change in appetite 0  Feeling bad or failure about yourself  3  Trouble concentrating 3  Moving slowly or fidgety/restless 1  Suicidal thoughts 1  PHQ-9 Score 15  Difficult doing work/chores Somewhat difficult    Quality of Life:   Personal Goals: Goals established at orientation with interventions provided to work toward goal. Personal Goals and Risk Factors at Admission - 04/21/17 0943      Core Components/Risk Factors/Patient Goals on Admission    Weight Management  Yes;Weight Loss;Obesity    Intervention  Weight Management: Develop a combined nutrition and exercise program designed to reach desired caloric intake, while maintaining appropriate intake of nutrient and fiber, sodium and fats, and appropriate energy expenditure required for the weight goal.;Weight Management: Provide education and appropriate resources to help participant work on and attain dietary goals.;Weight Management/Obesity: Establish reasonable short term and long term weight goals.;Obesity: Provide education and appropriate resources to help participant work on and attain dietary goals.     Admit Weight  274 lb 3.2 oz (124.4 kg)    Goal Weight: Short Term  270 lb (122.5 kg)    Goal Weight: Long Term  200 lb (90.7 kg) eventually she would like to get back to 150 lbs    Expected Outcomes  Short Term: Continue to assess and modify interventions until short term weight is achieved;Long Term: Adherence to nutrition and physical activity/exercise program aimed toward attainment of established weight goal;Weight Loss: Understanding of general recommendations for a balanced deficit meal plan, which promotes 1-2 lb weight loss per week and includes a negative energy balance of [phone] kcal/d;Understanding recommendations for meals to include 15-35% energy as protein, 25-35% energy from fat, 35-60% energy from carbohydrates, less than 200mg  of dietary cholesterol, 20-35 gm of total fiber daily;Understanding of distribution of calorie intake throughout the day with the consumption of 4-5 meals/snacks    Improve shortness of breath with ADL's  Yes    Intervention  Provide education, individualized exercise plan and daily activity instruction to help decrease symptoms of SOB with activities of daily living.    Expected Outcomes  Short Term: Achieves a reduction of symptoms when performing activities of daily living.        Personal Goals Discharge:   Exercise Goals and Review: Exercise Goals    Row Name 04/21/17 1111             Exercise Goals   Increase Physical Activity  Yes       Intervention  Provide advice, education, support and counseling about physical activity/exercise needs.;Develop an individualized exercise prescription for aerobic and resistive training based on initial evaluation findings, risk stratification, comorbidities and participant's personal goals.       Expected Outcomes  Achievement of increased cardiorespiratory fitness and enhanced flexibility, muscular endurance and strength shown through measurements of functional capacity and personal statement of participant.  Increase Strength and Stamina  Yes       Intervention  Provide advice, education, support and counseling about physical activity/exercise needs.;Develop an individualized exercise prescription for aerobic and resistive training based on initial evaluation findings, risk stratification, comorbidities and participant's personal goals.       Expected Outcomes  Achievement of increased cardiorespiratory fitness and enhanced flexibility, muscular endurance and strength shown through measurements of functional capacity and personal statement of participant.       Able to understand and use rate of perceived exertion (RPE) scale  Yes       Intervention  Provide education and explanation on how to use RPE scale       Expected Outcomes  Short Term: Able to use RPE daily in rehab to express subjective intensity level;Long Term:  Able to use RPE to guide intensity level when exercising independently       Able to understand and use Dyspnea scale  Yes       Intervention  Provide education and explanation on how to use Dyspnea scale       Expected Outcomes  Short Term: Able to use Dyspnea scale daily in rehab to express subjective sense of shortness of breath during exertion;Long Term: Able to use Dyspnea scale to guide intensity level when exercising independently       Knowledge and understanding of Target Heart Rate Range (THRR)  Yes       Intervention  Provide education and explanation of THRR including how the numbers were predicted and where they are located for reference       Expected Outcomes  Short Term: Able to state/look up THRR;Long Term: Able to use THRR to govern intensity when exercising independently;Short Term: Able to use daily as guideline for intensity in rehab       Able to check pulse independently  Yes       Intervention  Provide education and demonstration on how to check pulse in carotid and radial arteries.;Review the importance of being able to check your own pulse for safety during  independent exercise       Expected Outcomes  Short Term: Able to explain why pulse checking is important during independent exercise;Long Term: Able to check pulse independently and accurately       Understanding of Exercise Prescription  Yes       Intervention  Provide education, explanation, and written materials on patient's individual exercise prescription       Expected Outcomes  Short Term: Able to explain program exercise prescription;Long Term: Able to explain home exercise prescription to exercise independently          Nutrition & Weight - Outcomes: Pre Biometrics - 04/21/17 1112      Pre Biometrics   Height  5' 5.2" (1.656 m)    Weight  274 lb 3.2 oz (124.4 kg)    Waist Circumference  46.5 inches    Hip Circumference  55 inches    Waist to Hip Ratio  0.85 %    BMI (Calculated)  45.35        Nutrition: Nutrition Therapy & Goals - 04/25/17 1341      Nutrition Therapy   Diet  TLC/ post bariatric surgery    Protein (specify units)  60g    Fiber  20 grams    Sodium  1500 grams      Personal Nutrition Goals   Nutrition Goal  Increase intake of non-starchy vegetables    Personal Goal #2  When eating out, choose a lean protein source + a side of veggies/ fruit or yogurt    Personal Goal #3  Continue to strive to eat on a regular schedule each day, aim for at least 3 meals / day      Intervention Plan   Intervention  Prescribe, educate and counsel regarding individualized specific dietary modifications aiming towards targeted core components such as weight, hypertension, lipid management, diabetes, heart failure and other comorbidities.    Expected Outcomes  Short Term Goal: Understand basic principles of dietary content, such as calories, fat, sodium, cholesterol and nutrients.;Short Term Goal: A plan has been developed with personal nutrition goals set during dietitian appointment.;Long Term Goal: Adherence to prescribed nutrition plan.       Nutrition  Discharge:   Education Questionnaire Score: Knowledge Questionnaire Score - 04/21/17 0938      Knowledge Questionnaire Score   Pre Score  13/18 Reviewed with patient       Goals reviewed with patient; copy given to patient.

## 2017-05-13 NOTE — Telephone Encounter (Signed)
Nichole Reynolds has not attended [facility] since 04/25/16. Called patient times 3. She stated that she had no way of getting to class. Informed her that if she can get regular transportation that she could continue the program. Patient verbalizes understanding.
